# Patient Record
Sex: Female | Born: 1975
Health system: Southern US, Community
[De-identification: ages and names within clinical notes are randomized; demographics above are authoritative.]

## PROBLEM LIST (undated history)

## (undated) DIAGNOSIS — O24419 Gestational diabetes mellitus in pregnancy, unspecified control: Secondary | ICD-10-CM

---

## 2012-02-07 ENCOUNTER — Ambulatory Visit: Payer: Self-pay | Admitting: Family Medicine

## 2012-02-22 ENCOUNTER — Other Ambulatory Visit (HOSPITAL_COMMUNITY)
Admission: RE | Admit: 2012-02-22 | Discharge: 2012-02-22 | Disposition: A | Discharge status: Home / Self Care | Payer: BC Managed Care – PPO | Source: Ambulatory Visit | Attending: Obstetrics and Gynecology | Admitting: Obstetrics and Gynecology

## 2012-02-22 DIAGNOSIS — Z01419 Encounter for gynecological examination (general) (routine) without abnormal findings: Secondary | ICD-10-CM | POA: Insufficient documentation

## 2012-05-10 ENCOUNTER — Other Ambulatory Visit: Payer: Self-pay | Admitting: Obstetrics and Gynecology

## 2012-05-10 DIAGNOSIS — O200 Threatened abortion: Secondary | ICD-10-CM

## 2012-05-26 ENCOUNTER — Encounter (HOSPITAL_COMMUNITY)
Admission: AD | Disposition: A | Discharge status: Home / Self Care | Payer: Self-pay | Source: Ambulatory Visit | Attending: Obstetrics and Gynecology

## 2012-05-26 ENCOUNTER — Encounter (HOSPITAL_COMMUNITY): Payer: Self-pay | Admitting: *Deleted

## 2012-05-26 ENCOUNTER — Inpatient Hospital Stay (HOSPITAL_COMMUNITY): Payer: BC Managed Care – PPO | Admitting: Anesthesiology

## 2012-05-26 ENCOUNTER — Ambulatory Visit: Admit: 2012-05-26 | Payer: Self-pay | Admitting: Obstetrics and Gynecology

## 2012-05-26 ENCOUNTER — Encounter (HOSPITAL_COMMUNITY): Payer: Self-pay | Admitting: Anesthesiology

## 2012-05-26 ENCOUNTER — Other Ambulatory Visit: Payer: Self-pay | Admitting: Obstetrics and Gynecology

## 2012-05-26 ENCOUNTER — Inpatient Hospital Stay (HOSPITAL_COMMUNITY)
Admission: AD | Admit: 2012-05-26 | Discharge: 2012-05-26 | Disposition: A | Discharge status: Home / Self Care | Payer: BC Managed Care – PPO | Source: Ambulatory Visit | Attending: Obstetrics and Gynecology | Admitting: Obstetrics and Gynecology

## 2012-05-26 DIAGNOSIS — O034 Incomplete spontaneous abortion without complication: Secondary | ICD-10-CM

## 2012-05-26 HISTORY — PX: DILATION AND EVACUATION: SHX1459

## 2012-05-26 LAB — DIFFERENTIAL
Basophils Absolute: 0 10*3/uL (ref 0.0–0.1)
Lymphocytes Relative: 32 % (ref 12–46)
Neutro Abs: 4.6 10*3/uL (ref 1.7–7.7)
Neutrophils Relative %: 61 % (ref 43–77)

## 2012-05-26 LAB — CBC
HCT: 35 % — ABNORMAL LOW (ref 36.0–46.0)
Platelets: 262 10*3/uL (ref 150–400)
RDW: 13.8 % (ref 11.5–15.5)
WBC: 7.5 10*3/uL (ref 4.0–10.5)

## 2012-05-26 SURGERY — DILATION AND EVACUATION, UTERUS
Anesthesia: Monitor Anesthesia Care | Site: Vagina | Wound class: Clean Contaminated

## 2012-05-26 MED ORDER — CEFAZOLIN SODIUM-DEXTROSE 2-3 GM-% IV SOLR
2.0000 g | INTRAVENOUS | Status: AC
  Administered 2012-05-26: 2 g via INTRAVENOUS
  Filled 2012-05-26: qty 50

## 2012-05-26 MED ORDER — LACTATED RINGERS IV SOLN
INTRAVENOUS | Status: DC
  Administered 2012-05-26: 20:00:00 via INTRAVENOUS

## 2012-05-26 MED ORDER — KETOROLAC TROMETHAMINE 30 MG/ML IJ SOLN
INTRAMUSCULAR | Status: DC | PRN
  Administered 2012-05-26: 30 mg via INTRAVENOUS

## 2012-05-26 MED ORDER — LIDOCAINE HCL 2 % IJ SOLN
INTRAMUSCULAR | Status: DC | PRN
  Administered 2012-05-26: 10 mL

## 2012-05-26 MED ORDER — IBUPROFEN 600 MG PO TABS: 600.0000 mg | ORAL_TABLET | Freq: Four times a day (QID) | ORAL | Status: AC | PRN

## 2012-05-26 MED ORDER — FENTANYL CITRATE 0.05 MG/ML IJ SOLN
INTRAMUSCULAR | Status: DC | PRN
  Administered 2012-05-26 (×4): 25 ug via INTRAVENOUS

## 2012-05-26 MED ORDER — MIDAZOLAM HCL 5 MG/5ML IJ SOLN
INTRAMUSCULAR | Status: DC | PRN
  Administered 2012-05-26: 2 mg via INTRAVENOUS

## 2012-05-26 MED ORDER — LIDOCAINE HCL (CARDIAC) 20 MG/ML IV SOLN
INTRAVENOUS | Status: DC | PRN
  Administered 2012-05-26: 50 mg via INTRAVENOUS

## 2012-05-26 MED ORDER — ONDANSETRON HCL 4 MG/2ML IJ SOLN
INTRAMUSCULAR | Status: DC | PRN
  Administered 2012-05-26: 4 mg via INTRAVENOUS

## 2012-05-26 MED ORDER — PROPOFOL 10 MG/ML IV EMUL
INTRAVENOUS | Status: DC | PRN
  Administered 2012-05-26: 200 ug/kg/min via INTRAVENOUS

## 2012-05-26 MED ORDER — METHYLERGONOVINE MALEATE 0.2 MG PO TABS: 0.2000 mg | ORAL_TABLET | Freq: Four times a day (QID) | ORAL | Status: AC

## 2012-05-26 SURGICAL SUPPLY — 18 items
CATH ROBINSON RED A/P 16FR (CATHETERS) ×2 IMPLANT
CLOTH BEACON ORANGE TIMEOUT ST (SAFETY) ×2 IMPLANT
DECANTER SPIKE VIAL GLASS SM (MISCELLANEOUS) ×2 IMPLANT
GLOVE BIO SURGEON STRL SZ7 (GLOVE) ×6 IMPLANT
GLOVE SKINSENSE STRL SZ6.0 (GLOVE) ×2 IMPLANT
GOWN PREVENTION PLUS LG XLONG (DISPOSABLE) ×4 IMPLANT
KIT BERKELEY 1ST TRIMESTER 3/8 (MISCELLANEOUS) ×2 IMPLANT
NEEDLE SPNL 22GX3.5 QUINCKE BK (NEEDLE) ×2 IMPLANT
NS IRRIG 1000ML POUR BTL (IV SOLUTION) ×2 IMPLANT
PACK VAGINAL MINOR WOMEN LF (CUSTOM PROCEDURE TRAY) ×2 IMPLANT
PAD PREP 24X48 CUFFED NSTRL (MISCELLANEOUS) ×2 IMPLANT
SET BERKELEY SUCTION TUBING (SUCTIONS) ×2 IMPLANT
SYR CONTROL 10ML LL (SYRINGE) ×2 IMPLANT
TOWEL OR 17X24 6PK STRL BLUE (TOWEL DISPOSABLE) ×4 IMPLANT
VACURETTE 10 RIGID CVD (CANNULA) IMPLANT
VACURETTE 7MM CVD STRL WRAP (CANNULA) IMPLANT
VACURETTE 8 RIGID CVD (CANNULA) ×2 IMPLANT
VACURETTE 9 RIGID CVD (CANNULA) IMPLANT

## 2012-05-26 NOTE — Discharge Instructions (Addendum)
Dilation and Curettage or Vacuum Curettage Care After Refer to this sheet in the next few weeks. These instructions provide you with general information on caring for yourself after your procedure. Your caregiver may also give you more specific instructions. Your treatment has been planned according to current medical practices, but problems sometimes occur. Call your caregiver if you have any problems or questions after your procedure. HOME CARE INSTRUCTIONS   Do not drive for 24 hours.   Wait 1 week before returning to strenuous activities.   Take your temperature 2 times a day for 4 days and write it down. Provide these temperatures to your caregiver if you develop a fever.   Avoid long periods of standing, and do no heavy lifting (more than 10 pounds or 4.5 kg), pushing, or pulling.   Limit stair climbing to once or twice a day.   Take rest periods often.   You may resume your usual diet.   Drink enough fluids to keep your urine clear or pale yellow.   You should return to your usual bowel function. If constipation should occur, you may:   Take a mild laxative with permission from your caregiver.   Add fruit and bran to your diet.   Drink more fluids.   Take showers instead of baths until your caregiver gives you permission to take baths.   Do not go swimming or use a hot tub until your caregiver gives you permission.   Try to have someone with you or available to you the first 24 to 48 hours, especially if you had a general anesthetic.   Do not douche, use tampons, or have intercourse until after your follow-up appointment, or when your caregiver approves.   Only take over-the-counter or prescription medicines for pain, discomfort, or fever as directed by your caregiver. Do not take aspirin. It can cause bleeding.   If a prescription was given, follow your caregiver's directions.   Keep all your follow-up appointments recommended by your caregiver.  SEEK MEDICAL CARE  IF:   You have increasing cramps or pain not relieved with medicine.   You have abdominal pain which does not seem to be related to the same area of earlier cramping and pain.   You have bad smelling vaginal discharge.   You have a rash.   You have problems with any medicine.  SEEK IMMEDIATE MEDICAL CARE IF:   You have bleeding that is heavier than a normal menstrual period.   You have a fever.   You have chest pain.   You have shortness of breath.   You feel dizzy or feel like fainting.   You pass out.   You have pain in your shoulder strap area.   You have heavy vaginal bleeding with or without blood clots.  MAKE SURE YOU:   Understand these instructions.   Will watch your condition.   Will get help right away if you are not doing well or get worse.  Document Released: 11/16/2000 Document Revised: 11/08/2011 Document Reviewed: 06/16/2009 [facility] Patient Information 2012 [place], [place].DISCHARGE INSTRUCTIONS: D&C / D&E The following instructions have been prepared to help you care for yourself upon your return home.   Personal hygiene: Marland Kitchen Use sanitary pads for vaginal drainage, not tampons. . Shower the day after your procedure. . NO tub baths, pools or Jacuzzis for 2-3 weeks. . Wipe front to back after using the bathroom.  Activity and limitations: . Do NOT drive or operate any equipment for 24 hours. The  effects of anesthesia are still present and drowsiness may result. . Do NOT rest in bed all day. . Walking is encouraged. . Walk up and down stairs slowly. . You may resume your normal activity in one to two days or as indicated by your physician.  Sexual activity: NO intercourse for at least 2 weeks after the procedure, or as indicated by your physician.  Diet: Eat a light meal as desired this evening. You may resume your usual diet tomorrow.  Return to work: You may resume your work activities in one to two days or as indicated by your doctor.  What to  expect after your surgery: Expect to have vaginal bleeding/discharge for 2-3 days and spotting for up to 10 days. It is not unusual to have soreness for up to 1-2 weeks. You may have a slight burning sensation when you urinate for the first day. Mild cramps may continue for a couple of days. You may have a regular period in 2-6 weeks.  Call your doctor for any of the following: . Excessive vaginal bleeding, saturating and changing one pad every hour. . Inability to urinate 6 hours after discharge from hospital. . Pain not relieved by pain medication. . Fever of 100.4 F or greater. . Unusual vaginal discharge or odor.  Return to office ________________ Call for an appointment ___________________  Patient's signature: ______________________  Nurse's signature ________________________  Post Anesthesia Care Unit [phone]

## 2012-05-26 NOTE — Transfer of Care (Signed)
Immediate Anesthesia Transfer of Care Note  Patient: Misty Gibson  Procedure(s) Performed: Procedure(s) (LRB): DILATATION AND EVACUATION (N/A)  Patient Location: PACU  Anesthesia Type: MAC  Level of Consciousness: awake  Airway & Oxygen Therapy: Patient Spontanous Breathing and Patient connected to nasal cannula oxygen  Post-op Assessment: Report given to PACU RN and Post -op Vital signs reviewed and stable  Post vital signs: stable  Complications: No apparent anesthesia complications

## 2012-05-26 NOTE — MAU Note (Signed)
Pt states was sent from Dr. Ginnie Smart office for [facility] for incomplete miscarriage, notes scant amt of bleeding, denies pain at present. Surgery scheduled at 2000 per pt. NPO food since 1200, last fluids at 1400-drank water.

## 2012-05-26 NOTE — Anesthesia Postprocedure Evaluation (Signed)
  Anesthesia Post-op Note  Patient: Misty Gibson  Procedure(s) Performed: Procedure(s) (LRB): DILATATION AND EVACUATION (N/A) Patient is awake and responsive. Pain and nausea are reasonably well controlled. Vital signs are stable and clinically acceptable. Oxygen saturation is clinically acceptable. There are no apparent anesthetic complications at this time. Patient is ready for discharge.

## 2012-05-26 NOTE — Anesthesia Preprocedure Evaluation (Signed)
Anesthesia Evaluation Anesthesia Physical Anesthesia Plan  ASA: II  Anesthesia Plan: General and MAC   Post-op Pain Management:    Induction: Intravenous  Airway Management Planned: LMA, Mask and Simple Face Mask  Additional Equipment:   Intra-op Plan:   Post-operative Plan:   Informed Consent: I have reviewed the patients History and Physical, chart, labs and discussed the procedure including the risks, benefits and alternatives for the proposed anesthesia with the patient or authorized representative who has indicated his/her understanding and acceptance.   Dental Advisory Given  Plan Discussed with: CRNA and Surgeon  Anesthesia Plan Comments: (  Discussed MAC and general anesthesia, including possible nausea, instrumentation of airway, sore throat,pulmonary aspiration, etc. I asked if the were any outstanding questions, or  concerns before we proceeded. )        Anesthesia Quick Evaluation

## 2012-05-26 NOTE — Brief Op Note (Signed)
05/26/2012  8:26 PM  PATIENT:  Misty Gibson  36 y.o. female  PRE-OPERATIVE DIAGNOSIS:  Incomplete ab   POST-OPERATIVE DIAGNOSIS:  Same  PROCEDURE:  Procedure(s) (LRB): DILATATION AND EVACUATION (N/A)  SURGEON:  Surgeon(s) and Role:    * Geryl Rankins, MD - Primary  PHYSICIAN ASSISTANT:   ASSISTANTS: none   ANESTHESIA:   MAC, Local Xylocaine 2 % 10 cc  EBL:  Total I/O In: 400 [I.V.:400] Out: 25 [Urine:25]  BLOOD ADMINISTERED:none  DRAINS: none   LOCAL MEDICATIONS USED:  XYLOCAINE   SPECIMEN:  Source of Specimen:  Products of conception  DISPOSITION OF SPECIMEN:  PATHOLOGY  COUNTS:  YES  TOURNIQUET:  * No tourniquets in log *  DICTATION: .Other Dictation: Dictation Number [id number]  PLAN OF CARE: Discharge to home after PACU  PATIENT DISPOSITION:  PACU - hemodynamically stable.   Delay start of Pharmacological VTE agent (>24hrs) due to surgical blood loss or risk of bleeding: not applicable

## 2012-05-26 NOTE — MAU Note (Signed)
Seen at the office today. Told to come to hospital for D & C

## 2012-05-26 NOTE — H&P (Signed)
05/26/2012  History of Present Illness  General:  36 y/o G2P1A1 presents for f/u on incomplete abortion. Pt is s/p Cytotec 800 mcg per vagina last week. She reports she has spotting during the day but passes small clots and has increased bleeding. She denies pelvic pain or fever/chills. After counseling, pt desires to proceed with a Suction D&C. Pt is due to travel tomorrow evening.  Ultrasound shows thickened EMS with blood flow suggestive of Retained POCs.   Current Medications  Prenatal 28-0.8 MG Tablet 1 tablet Once a day  Medication List reviewed and reconciled with the patient   Past Medical History  1st pregnancy - ? HELLP/preeclampsia - "acute fatty liver syndrome" per patient  borderline vitamin B12 deficiency  Borderline iron deficiency anemia   Surgical History  c-section 2009   Family History  Father: alive 37 yrs HTN   Mother: alive 55 yrs HTN, kidney disease   Brother 1: alive 15 yrs A + W   daughter - seizure disorder (possibly just febrile seizures), denies any GYN family cancer hx.   Social History  General:  History of smoking  cigarettes: Never smoked no Smoking.  no Alcohol.  no Caffeine.  no Recreational drug use.  Exercise: yes, minimal, walks.  Occupation: stay at home mom.  Education: Masters Lobby[employer].  Marital Status: married.  Children: 1, girls.    Gyn History  Sexual activity yes.  LMP 03/14/12, spotting since this time.  Denies H/O Birth control .  Last pap smear date 02/22/12.  Denies H/O Last mammogram date .  Denies H/O Abnormal pap smear .  Denies H/O STD .    OB History  Number of pregnancies 2.  miscarriages 0.  abortion 1.  Pregnancy # 1 abortion.  Pregnancy # 2 live birth, C-section, girl.    Allergies  N.K.D.A.   Hospitalization/Major Diagnostic Procedure  c-section 2009   Review of Systems  See HPI.   Vital Signs  Wt 179, Ht 64.5, BMI 30.25, Pulse sitting 100, BP sitting 100/57.   Physical Examination    GENERAL:  Patient appears alert and oriented.  General Appearance: well-appearing, well-developed, no acute distress.  Speech: clear.  ABDOMEN:  General: non tender, non distended.  FEMALE GENITOURINARY:  Cervix visualized, healthy appearing, no discharge, no lesions, external os appears dilated but internal os closed.  Vagina: pink/moist mucosa, no lesions, dark brown blood, no foul odor, no active bleeding.  Vulva: normal, no lesions, no skin discoloration, blood at perineum.     Assessments   1. Miscarriage - 634.90 (Primary), incomplete   2. Incomplete miscarriage - 634.91   Treatment  1. Miscarriage  LAB: Urine HCG   Negative Neg    Jalan Bodi B 05/26/2012 06:38:04 PM >    Diagnostic Imaging:US ECHO TRANSVAGINAL  PREGNANCY TEST POSITVE. NEG WAS ERROR.  Referral To:Geryl Rankins [facility] - Gynecology Reason:Precert and schedule for Suction D&C tonight at 8 pm     2. Incomplete miscarriage  Pt counseled on R/B/A of D&C. Desires to proceed with surgery.    Procedure Codes  13086 ECHO TRANSVAGINAL  81025 URINE PREGNANCY TEST   Follow Up  2 Weeks post op

## 2012-05-27 ENCOUNTER — Encounter (HOSPITAL_COMMUNITY): Payer: Self-pay | Admitting: Obstetrics and Gynecology

## 2012-05-27 NOTE — Op Note (Signed)
NAMEMESHELLE, HOLNESS NO.:  [id number]  MEDICAL RECORD NO.:  [id number]  LOCATION:  [facility]                          FACILITY:  [facility]  PHYSICIAN:  Pieter Partridge, MD   DATE OF BIRTH:  August 28, 1976  DATE OF PROCEDURE:  05/26/2012 DATE OF DISCHARGE:  05/26/2012                              OPERATIVE REPORT   PREOPERATIVE DIAGNOSIS:  Incomplete abortion.  POSTOPERATIVE DIAGNOSIS:  Incomplete abortion.  PROCEDURES:  Suction, D and C.  SURGEON:  Pieter Partridge, MD  ASSISTANT:  None.  ANESTHESIA:  MAC with 2% Xylocaine, 10 mL local.  EBL:  Minimal.  URINE OUTPUT PRIOR TO THE CASE:  25 mL.  SPECIMEN:  Products of conception to Pathology.  DISPOSITION:  To PACU, hemodynamically stable.  PLAN OF CARE:  Discharge after PACU.  INDICATIONS:  Misty Gibson is a 36 year old gravida 2, para 1, who was trying to conceive.  She started to have bleeding and at that time was diagnosed with a threatened miscarriage at approximately 6 weeks.  There was an intrauterine gestational sac, but there was no fetal pole noted as would be expected based on weeks of gestation and also quantitative beta-HCG levels.  Quants were followed as subsequently were declining. The patient was counseled on medical versus surgical management versus expectant management, and the patient desired to proceed with medical therapy.  She was given Cytotec and had Cytotec twice, but her ultrasound today still shows some retained products with echogenic material in the cavity with some blood flow due to these products.  The patient was counseled on surgical management and desired to proceed.  PROCEDURE:  The patient was identified in the maternity admission unit. She was then taken to the operating room where she underwent MAC anesthesia without complication.  She was then placed in the dorsal lithotomy position, and prepped and draped in a normal sterile fashion. I and O bladder was done prior to  beginning of the procedure.  A Pederson speculum was placed in the vagina.  The paracervical block was performed with 10 mL of Xylocaine 2%.  Single-tooth tenaculum was then used to grasp the anterior lip of the cervix.  The cervix was dilated up to a 9 Hegar dilator easily.  Uterus was sounded to 7 cm.  An 8-mm suction curette was then advanced to the uterine fundus and a large clot with tissue consistent with products of conception was visualized. Sharp curettage of the uterus was performed and gritty cry was noted with just one pass of the curette.  Another suction curettage was performed.  No bleeding was noted at the end of the procedure.  Single- tooth tenaculum was removed and the tenaculum site was hemostatic.  All instruments were removed from the vagina.  The patient was tolerated the procedure well.  She has taken to the recovery room in stable condition. She had Ancef 2 g IV prior to procedure.  SCDs were not placed due to decreased length of case, she tolerated it well.     Pieter Partridge, MD     EBV/MEDQ  D:  05/26/2012  T:  05/27/2012  Job:  [id number]

## 2012-11-19 ENCOUNTER — Other Ambulatory Visit: Payer: Self-pay | Admitting: Obstetrics and Gynecology

## 2012-11-19 DIAGNOSIS — O26859 Spotting complicating pregnancy, unspecified trimester: Secondary | ICD-10-CM

## 2012-12-04 ENCOUNTER — Other Ambulatory Visit (HOSPITAL_COMMUNITY): Payer: BC Managed Care – PPO

## 2012-12-05 LAB — OB RESULTS CONSOLE HIV ANTIBODY (ROUTINE TESTING): HIV: NONREACTIVE

## 2012-12-08 LAB — OB RESULTS CONSOLE RUBELLA ANTIBODY, IGM: Rubella: NON-IMMUNE/NOT IMMUNE

## 2012-12-10 ENCOUNTER — Other Ambulatory Visit: Payer: Self-pay

## 2012-12-17 LAB — OB RESULTS CONSOLE GC/CHLAMYDIA
Chlamydia: NEGATIVE
Gonorrhea: NEGATIVE

## 2012-12-23 ENCOUNTER — Other Ambulatory Visit: Payer: Self-pay | Admitting: Obstetrics and Gynecology

## 2012-12-23 DIAGNOSIS — Z3682 Encounter for antenatal screening for nuchal translucency: Secondary | ICD-10-CM

## 2013-01-13 ENCOUNTER — Ambulatory Visit (HOSPITAL_COMMUNITY): Payer: BC Managed Care – PPO

## 2013-01-14 ENCOUNTER — Ambulatory Visit (HOSPITAL_COMMUNITY): Payer: BC Managed Care – PPO

## 2013-01-14 ENCOUNTER — Ambulatory Visit (HOSPITAL_COMMUNITY): Admission: RE | Admit: 2013-01-14 | Payer: BC Managed Care – PPO | Source: Ambulatory Visit

## 2013-01-14 ENCOUNTER — Encounter (HOSPITAL_COMMUNITY): Payer: BC Managed Care – PPO

## 2013-01-16 ENCOUNTER — Ambulatory Visit (HOSPITAL_COMMUNITY): Payer: BC Managed Care – PPO

## 2013-01-16 ENCOUNTER — Ambulatory Visit (HOSPITAL_COMMUNITY): Admission: RE | Admit: 2013-01-16 | Payer: BC Managed Care – PPO | Source: Ambulatory Visit

## 2013-01-20 ENCOUNTER — Other Ambulatory Visit: Payer: Self-pay | Admitting: Obstetrics and Gynecology

## 2013-01-20 ENCOUNTER — Ambulatory Visit (HOSPITAL_COMMUNITY)
Admission: RE | Admit: 2013-01-20 | Discharge: 2013-01-20 | Disposition: A | Discharge status: Home / Self Care | Payer: BC Managed Care – PPO | Source: Ambulatory Visit | Attending: Obstetrics and Gynecology | Admitting: Obstetrics and Gynecology

## 2013-01-20 ENCOUNTER — Encounter (HOSPITAL_COMMUNITY): Payer: Self-pay

## 2013-01-20 ENCOUNTER — Other Ambulatory Visit: Payer: Self-pay

## 2013-01-20 DIAGNOSIS — Z3682 Encounter for antenatal screening for nuchal translucency: Secondary | ICD-10-CM

## 2013-01-20 DIAGNOSIS — O09529 Supervision of elderly multigravida, unspecified trimester: Secondary | ICD-10-CM | POA: Insufficient documentation

## 2013-01-20 DIAGNOSIS — K760 Fatty (change of) liver, not elsewhere classified: Secondary | ICD-10-CM

## 2013-01-20 DIAGNOSIS — O34219 Maternal care for unspecified type scar from previous cesarean delivery: Secondary | ICD-10-CM | POA: Insufficient documentation

## 2013-01-20 NOTE — Consult Note (Signed)
Maternal Fetal Medicine Consultation  Requesting Provider(s): Geryl Rankins, MD  Reason for consultation: History of Acute Fatty Liver of pregnancy  HPI: Misty Gibson is a 37 yo [id number] currently at 73 6/7 weeks- seen today due to a history of acute fatty liver of pregnancy.  [name] was diagnosed with this disorder in 2009.  She reports that her pregnancy was otherwise uncomplicated - developed "dramatic" lower extremity swelling and facial edema at approximately [redacted] weeks gestation - later developed profuse vomiting, oliguria, loss of appetite and some vaginal bleeding.  She was seen on L&D and noted to have elevated LFTs and hypoglycemia. She was diagnosed with acute fatty liver and underwent a primary C-section at 36 weeks.  She was hospitalized for approximately 2 weeks after delivery - primarily due to pleural effusion / pulmonary edema with a normalization of the LFTs.  She reports that the newborn had an extensive work up in the NICU and to her knowledge has no liver issues and is currently doing well.  Her C-section scar was left open and healed by secondary intent.  [name] is without complaints today.  Her pregnancy thus far has been uncomplicated.  OB History: OB History   Grav Para Term Preterm Abortions TAB SAB Ect Mult Living   4    2 1 1   1     G1 - TAB G2- 36 week C-section due to acute fatty liver G3- SAB  PMH: neg  PSH:   Past Surgical History  Procedure Laterality Date  . Cesarean section  2009  . Dilation and evacuation  05/26/2012    Procedure: DILATATION AND EVACUATION;  Surgeon: Geryl Rankins, MD;  Location: [facility];  Service: Gynecology;  Laterality: N/A;   Meds: Prenatal vitamins  Allergies: NKDA  FH: Maternal grandfather "liver cirrhosis" - not felt to be due to alcohol. ? Viral hepatitis  Soc: denies tobacco, ETOH or illicit drug use  Review of Systems: no vaginal bleeding or cramping/contractions, no LOF, no nausea/vomiting. All other systems  reviewed and are negative.   PE: Wt: 197 #, BP 103/65, Pulse 87  GEN: well-appearing female ABD: gravid, NT  Please see separate document for fetal ultrasound report.   A/P: 1) IUP At 13 6/7 weeks         2) Advanced maternal age - see separate ultrasound report.  The patient elected to undergo NIPT (cell free fetal DNA).  Recommend follow up for detailed anatomy ultrasound at [redacted] weeks gestation         3) History of acute fatty liver of pregnancy - Acute fatty liver of pregnancy is a rare disorder, thought to occur in 1:7000- 1:20,000 deliveries.  The disorder typically occurs during the 3rd trimester of pregnancy and [name] describes a typical presentation for this disorder. About 1/2 of patients will have signs of preeclampsia at presentation or at some time during the course of the illness. There appears to be an association of cases of acute fatty liver of pregnancy with one of the inherited defects in mitochondrial beta-oxidation of fatty acids,  long-chain 3-hydroxyacyl CoA dehydrogenase deficiency (LCHAD).  [name] reports that she has never previously undergone genetic testing for this disorder, but the fact that her child underwent extensive testing in the NICU and is currently doing well would make this much less likely.  Acute fatty liver of pregnancy can recur in subsequent pregnancies, even if the LCHAD mutation is negative.  Due to the rarity of this disorder, the exact  risk of recurrence is unknown.  Recommendations: 1) Would recommend requesting records from the patient's previous delivery 2) If not already done - recommend baseline LFTs, preeclampsia labs and 24-hr urine protein / Creatinine clearance.  Would consider checking LFTs at least every trimester 3) Serial growth scans in the 3rd trimester 4) Antepartum fetal testing (NSTs) beginning at 32 weeks 5) We will explore testing the LCHAD - will contact the patient once we are able to work out the logistics and  appropriate lab.   6) Would have low threshold for checking LFTs if the patient develops typical symptoms in the 3rd trimester (vomiting, RUQ pain, anorexia)  Thank you for the opportunity to be a part of the care of [name]. Please contact our office if we can be of further assistance.   I spent approximately 30 minutes with this patient with over 50% of time spent in face-to-face counseling.  Alpha Gula, MD Maternal Fetal Medicine

## 2013-01-30 ENCOUNTER — Telehealth (HOSPITAL_COMMUNITY): Payer: Self-pay | Admitting: MS"

## 2013-01-30 NOTE — Telephone Encounter (Signed)
Called Misty Gibson to discuss her [facility], cell free fetal DNA testing. Patient was identified by name and DOB. Testing was offered because of advanced maternal age. We reviewed that these are within normal limits, showing a less than 1 in 10,000 risk for trisomies 21, 18 and 13. We reviewed that this testing identifies > 99% of pregnancies with trisomy 21, >98% of pregnancies with trisomy 28, and >80% with trisomy 83; the false positive rate is <0.1% for all conditions.  She understands that this testing does not identify all genetic conditions.  Misty Gibson asked if she needs to consider amniocentesis given this result. We reviewed that NIPT is a screen, and thus, amniocentesis would be available if she desired diagnostic testing in the pregnancy. We reviewed the associated risk of complications with amniocentesis as well as the detection rates of NIPT. Reviewed that targeted ultrasound in the second trimester is also a helpful screening tool for chromosome conditions but is also not diagnostic. Misty Gibson stated that she feels comfortable with these results and likely will not pursue amniocentesis in pregnancy.   Additionally, we discussed the option of carrier testing for the common LCHAD mutation, which was discussed at the time of Misty Gibson MFM consult on 01/20/13 given her history of acute fatty liver of pregnancy. Discussed that the patient can return for lab draw at any point or this can also be done at the time of her previously scheduled anatomy ultrasound on 02/23/13. Patient stated that she would wait until 02/23/13 appointment given that her husband will also be with her and information about the testing can be reviewed with both of them at that point. All questions were answered to her satisfaction, she was encouraged to call with additional questions or concerns.  Quinn Plowman, MS Patent attorney

## 2013-02-19 ENCOUNTER — Other Ambulatory Visit: Payer: Self-pay | Admitting: Obstetrics and Gynecology

## 2013-02-23 ENCOUNTER — Ambulatory Visit (HOSPITAL_COMMUNITY): Payer: BC Managed Care – PPO

## 2013-02-23 ENCOUNTER — Other Ambulatory Visit (HOSPITAL_COMMUNITY): Payer: BC Managed Care – PPO

## 2013-02-24 ENCOUNTER — Other Ambulatory Visit: Payer: Self-pay | Admitting: Obstetrics and Gynecology

## 2013-02-24 ENCOUNTER — Ambulatory Visit (HOSPITAL_COMMUNITY)
Admission: RE | Admit: 2013-02-24 | Discharge: 2013-02-24 | Disposition: A | Discharge status: Home / Self Care | Payer: BC Managed Care – PPO | Source: Ambulatory Visit | Attending: Obstetrics and Gynecology | Admitting: Obstetrics and Gynecology

## 2013-02-24 ENCOUNTER — Other Ambulatory Visit: Payer: Self-pay

## 2013-02-24 VITALS — BP 108/64 | HR 90 | Wt 199.5 lb

## 2013-02-24 DIAGNOSIS — O358XX0 Maternal care for other (suspected) fetal abnormality and damage, not applicable or unspecified: Secondary | ICD-10-CM | POA: Insufficient documentation

## 2013-02-24 DIAGNOSIS — Z0489 Encounter for examination and observation for other specified reasons: Secondary | ICD-10-CM

## 2013-02-24 DIAGNOSIS — O09522 Supervision of elderly multigravida, second trimester: Secondary | ICD-10-CM

## 2013-02-24 DIAGNOSIS — Z1389 Encounter for screening for other disorder: Secondary | ICD-10-CM | POA: Insufficient documentation

## 2013-02-24 DIAGNOSIS — Z363 Encounter for antenatal screening for malformations: Secondary | ICD-10-CM | POA: Insufficient documentation

## 2013-02-24 DIAGNOSIS — O09529 Supervision of elderly multigravida, unspecified trimester: Secondary | ICD-10-CM | POA: Insufficient documentation

## 2013-02-24 DIAGNOSIS — O34219 Maternal care for unspecified type scar from previous cesarean delivery: Secondary | ICD-10-CM | POA: Insufficient documentation

## 2013-02-24 DIAGNOSIS — Z8751 Personal history of pre-term labor: Secondary | ICD-10-CM | POA: Insufficient documentation

## 2013-02-24 NOTE — Progress Notes (Signed)
Lamija Besse  was seen today for an ultrasound appointment.  See full report in [facility].  Comments: Ms. Aguado was seen today due to advanced maternal age and history of acute fatty liver of pregnancy with her last delivery.  See previous consult.  NIPT (cell free fetal DNA) was previously performed that was low risk for aneuploidy.  A small Choroid plexus cyst was noted.  This is a common variant (1-2%) of all pregnancies, and carries a small association with Trisomy 18.  However, there were no other ultrasound stigmata of Trisomy 68 and with a low risk NIPT, the risk fo Trisomy 18 is essentially negligible.   Impression: Single IUP at 19 4/7 weeks Isolated choroid plexus cyst The remainder of the fetal anatomy is normal although somewhat limited views of the fetal heart were obtained No other markers associated with aneuploidy were noted Marginal posterior placenta previa Normal amniotic fluid volume  Recommendations: Recommend follow-up ultrasound examination in 6 weeks for growth and to reevaluate the fetal heart / placental location  Alpha Gula, MD

## 2013-03-09 ENCOUNTER — Telehealth (HOSPITAL_COMMUNITY): Payer: Self-pay | Admitting: MS"

## 2013-03-09 NOTE — Telephone Encounter (Signed)
Called Ms. 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 Ms. Borchardt was offered carrier testing for the common mutation in the HADHA gene, given her history of acute fatty liver in pregnancy. Women who are carriers of HADHA mutations are at increased risk to develop acute fatty liver of pregnancy, particularly when the fetus in that pregnancy is affected with Long Chain 3-hydroxyacyl-CoA dehydrogenase (LCHAD) deficiency.   Ms. Hovsepian testing was normally, with no detection of the [id number] mutation on either of her HADHA alleles. We reviewed that this mutation is found in approximately 75-87% of cases of LCHAD deficiency. Thus, this normal testing does not rule out carrier status, but greatly reduces the chance that she is a carrier. Additionally, we discussed that given that her daughter is currently 62 years old and does not appear to have symptoms of LCHAD deficiency also reduces the chance that Ms. Renninger's previous acute fatty liver of pregnancy is due to carrier status for LCHAD deficiency. We reviewed that sequencing of the HADHA gene is available, which would be expected to detect an additional approximate 13-25% of mutations in the HADHA gene. However, we also reviewed the additional cost of gene sequencing. Ms. Thielke stated that she feels comfortable with the results of this testing at this time and planned to further discuss these results at the time of her follow-up ultrasound in our office on 04/08/13.   Clydie Braun Oswald Pott 03/09/2013 12:20 PM

## 2013-04-08 ENCOUNTER — Ambulatory Visit (HOSPITAL_COMMUNITY)
Admission: RE | Admit: 2013-04-08 | Discharge: 2013-04-08 | Disposition: A | Discharge status: Home / Self Care | Payer: BC Managed Care – PPO | Source: Ambulatory Visit | Attending: Obstetrics and Gynecology | Admitting: Obstetrics and Gynecology

## 2013-04-08 VITALS — BP 93/59 | HR 100 | Wt 204.2 lb

## 2013-04-08 DIAGNOSIS — IMO0002 Reserved for concepts with insufficient information to code with codable children: Secondary | ICD-10-CM

## 2013-04-08 DIAGNOSIS — O4400 Placenta previa specified as without hemorrhage, unspecified trimester: Secondary | ICD-10-CM | POA: Insufficient documentation

## 2013-04-08 DIAGNOSIS — O09522 Supervision of elderly multigravida, second trimester: Secondary | ICD-10-CM

## 2013-04-08 DIAGNOSIS — Z3689 Encounter for other specified antenatal screening: Secondary | ICD-10-CM | POA: Insufficient documentation

## 2013-04-08 DIAGNOSIS — O350XX0 Maternal care for (suspected) central nervous system malformation in fetus, not applicable or unspecified: Secondary | ICD-10-CM | POA: Insufficient documentation

## 2013-04-08 DIAGNOSIS — O3500X0 Maternal care for (suspected) central nervous system malformation or damage in fetus, unspecified, not applicable or unspecified: Secondary | ICD-10-CM | POA: Insufficient documentation

## 2013-04-08 DIAGNOSIS — Z0489 Encounter for examination and observation for other specified reasons: Secondary | ICD-10-CM

## 2013-04-08 NOTE — Progress Notes (Signed)
Misty Gibson  was seen today for an ultrasound appointment.  See full report in [facility].  Impression: Single IUP at 25 6/7 weeks Normal interval anatomy.  The previously noted choroid plexus cyst appears to have resolved Fetal growth is appropriate (60th %tile) Marginal posterior placenta previa Normal amniotic fluid volume  Recommendations: Recommend follow-up ultrasound examination in 4 weeks for interval growth and to reevaluate placental location.  Alpha Gula, MD

## 2013-04-18 IMAGING — US US OB COMP LESS 14 WK
1 series · 13 of 28 positions shown · non-contrast
Comparison: none

[Series 1: us ob comp less 14 wk · 0.18mm/px · 13 of 44 slices shown]
[im 2/44]
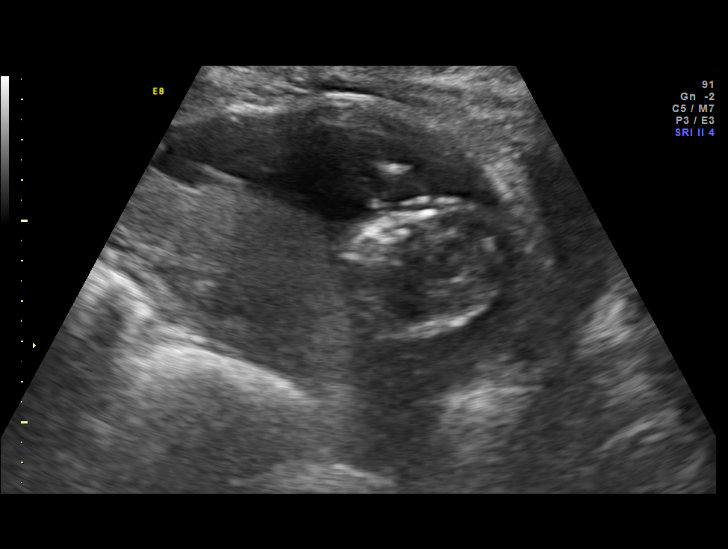
[im 5/44]
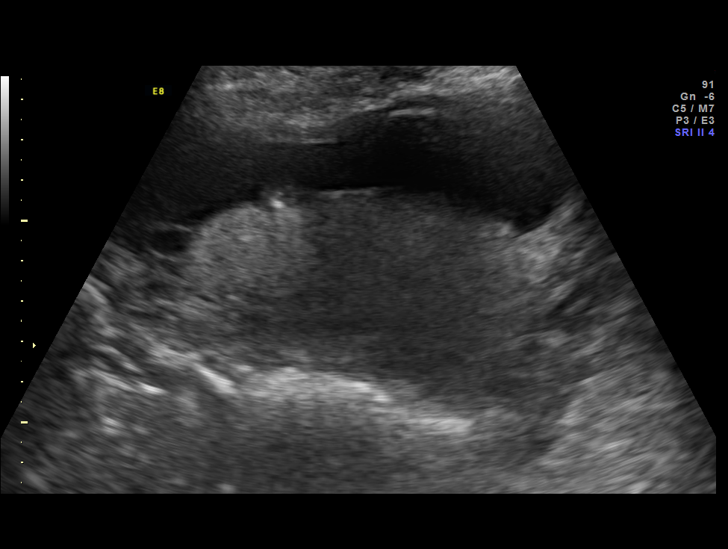
[im 8/44]
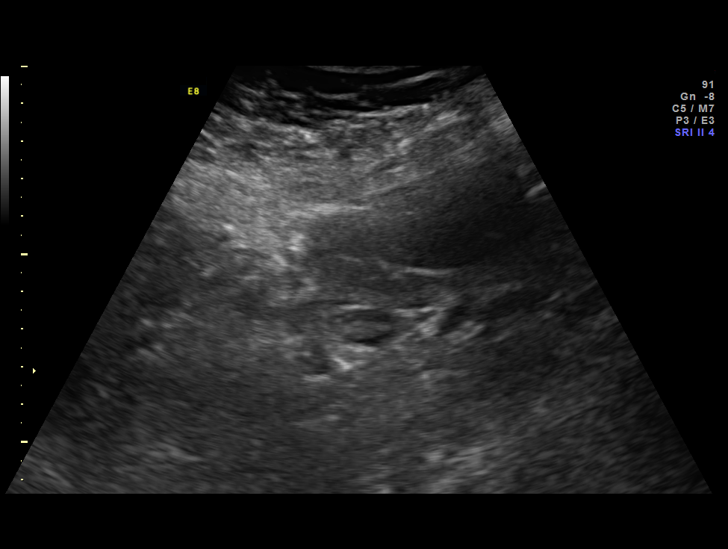
[im 12/44]
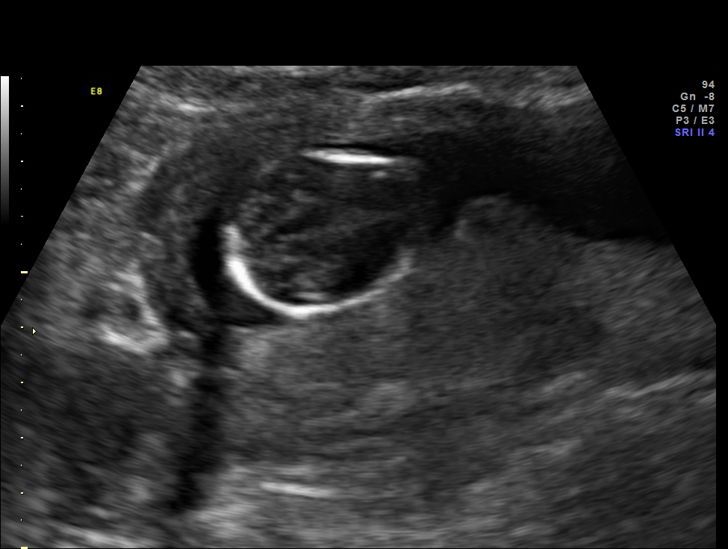
[im 15/44]
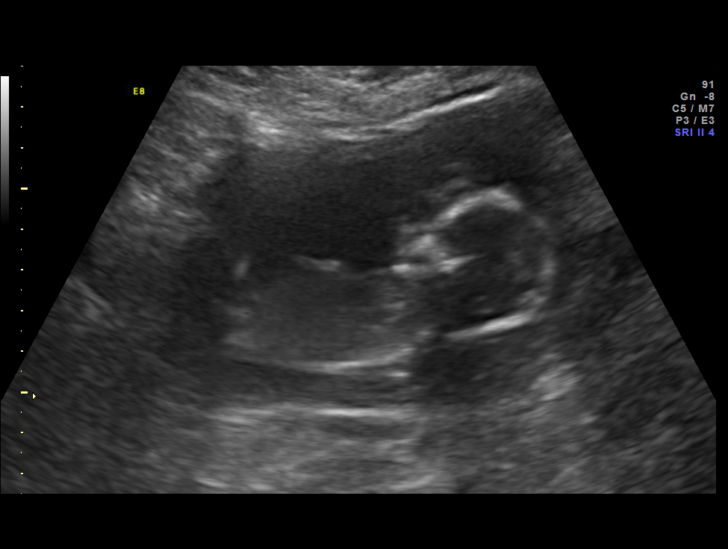
[im 18/44]
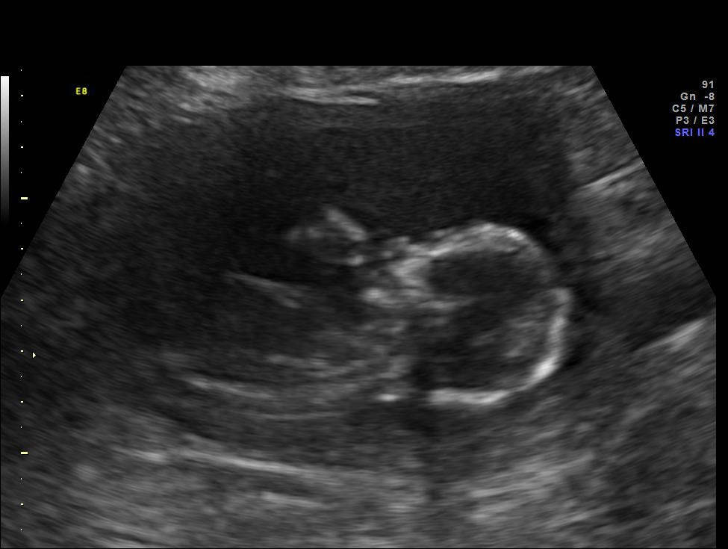
[im 23/44]
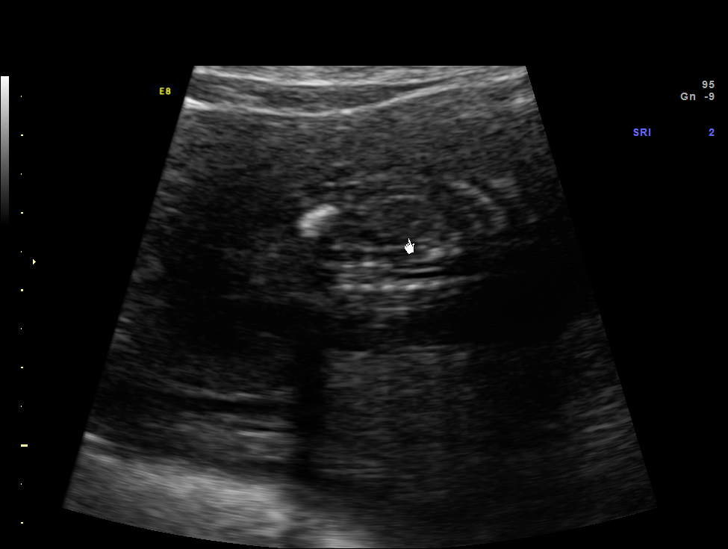
[im 26/44]
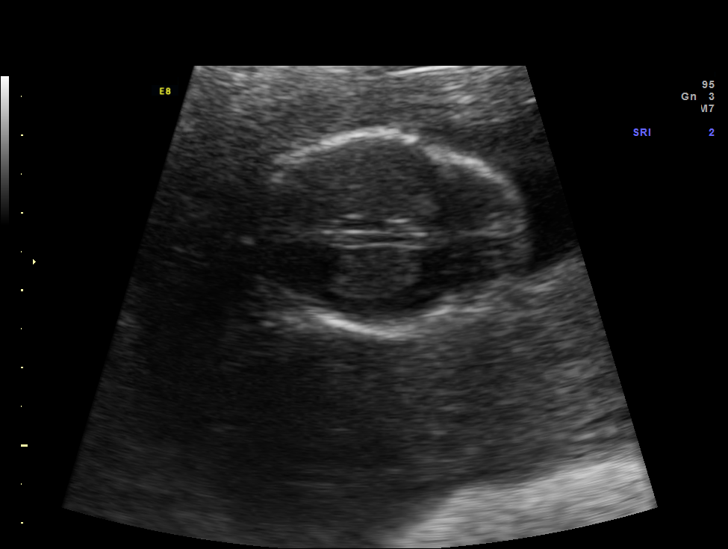
[im 29/44]
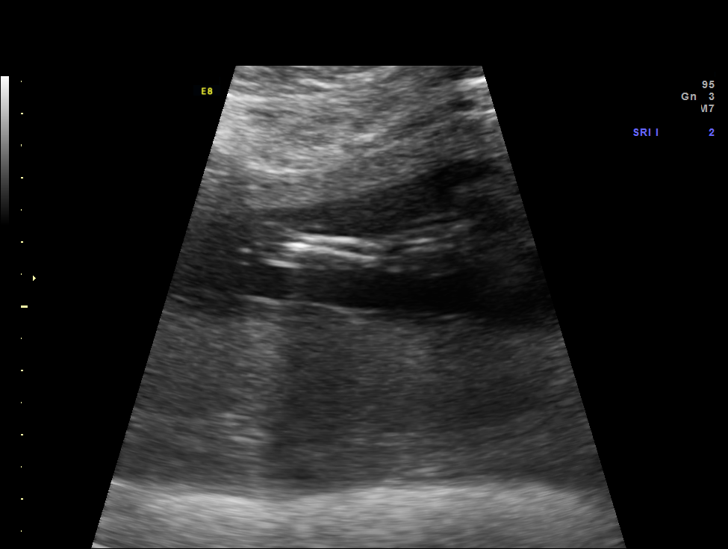
[im 32/44]
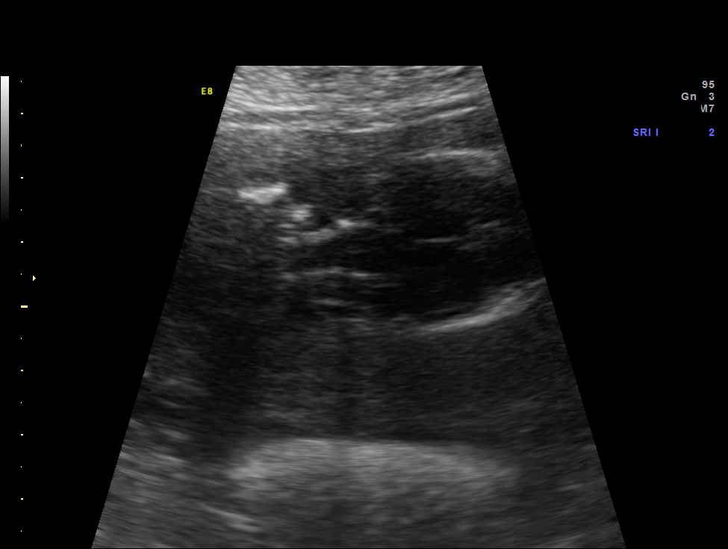
[im 36/44]
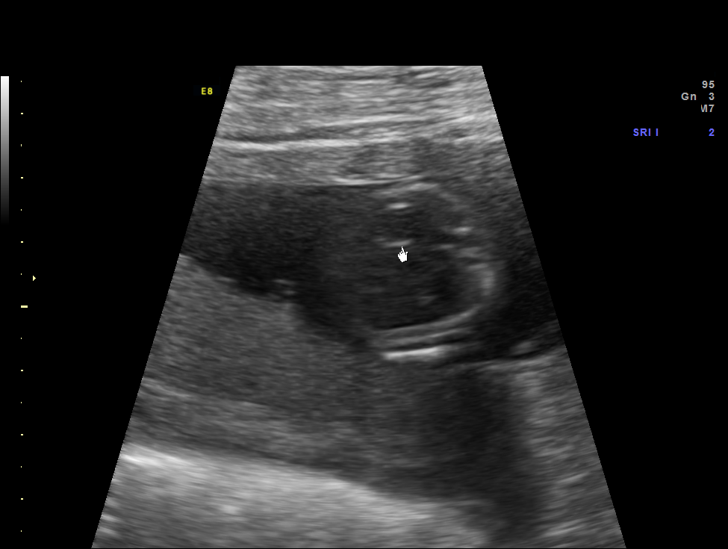
[im 39/44]
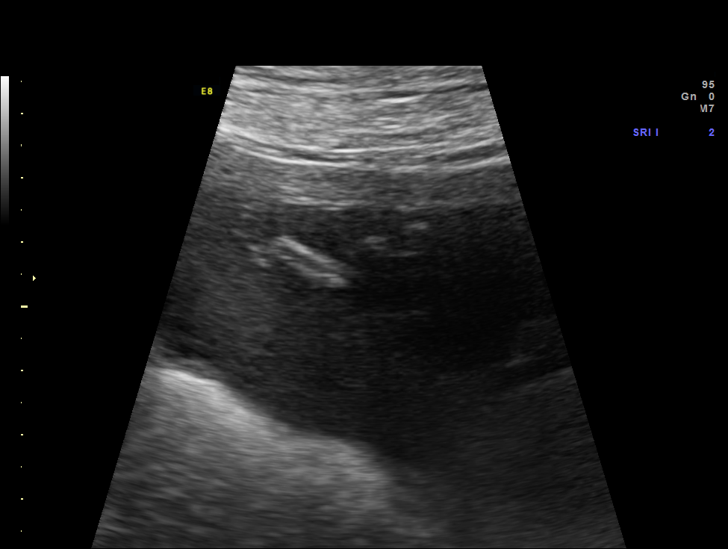
[im 42/44]
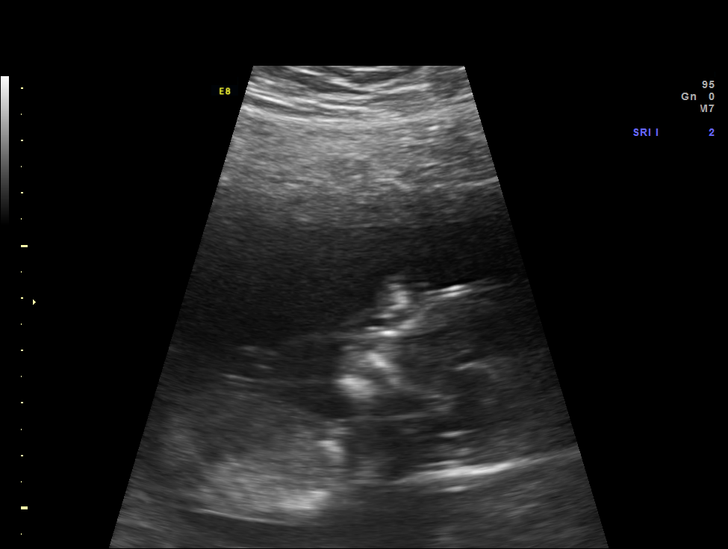

[13 of 28 positions shown; findings below may reference images not displayed]

OBSTETRICS REPORT
                      (Signed Final 01/20/2013 [DATE])

Service(s) Provided

 US OB COMP LESS 14 WKS                                76801.0
Indications

 Advanced maternal age (AMA), Multigravida
 Previous cesarean section
Fetal Evaluation

 Num Of Fetuses:    1
 Fetal Heart Rate:  169                          bpm
 Cardiac Activity:  Observed
 Presentation:      Cephalic
 Placenta:          Posterior
 P. Cord            Visualized
 Insertion:

 Amniotic Fluid
 AFI FV:      Subjectively within normal limits
Biometry

 BPD:     26.1  mm     G. Age:  14w 4d
Gestational Age

 LMP:           13w 6d        Date:  10/15/12                 EDD:   07/22/13
 U/S Today:     14w 4d                                        EDD:   07/17/13
 Best:          14w 5d     Det. By:  Early Ultrasound         EDD:   07/16/13
                                     (12/10/12)
Anatomy

 Cranium:          Appears normal         Abdominal Wall:   Appears nml (cord
                                                            insert, abd wall)
 Choroid Plexus:   Appears normal         Cord Vessels:     Appears normal (3
                                                            vessel cord)
 Stomach:          Appears normal, left   Bladder:          Appears normal
                   sided
Cervix Uterus Adnexa

 Cervix:       Normal appearance by transabdominal scan.
 Left Ovary:    Not visualized. No adnexal mass visualized.
 Right Ovary:   Within normal limits.
Impression

 IUP at 14+5 weeks
 No gross abnormalities identified
 NT measurement was within normal limits for this GA; NB
 present
 Normal amniotic fluid volume
 Measurements consistent with early US

 CRL was too large to perform first trimester screening. All of
 her prenatal testing and pregnancy management options
 were reviewed. After careful consideration, she chose to have
 cell free fetal DNA screening ([facility]).

 We will forward the results to you as soon as they are
 available ([facility]).
Recommendations

 Offer [facility] in the second trimester for ONTD screening
 Offer detailed U/S by 18 weeks

 questions or concerns.

## 2013-05-06 ENCOUNTER — Ambulatory Visit (HOSPITAL_COMMUNITY): Payer: BC Managed Care – PPO

## 2013-05-07 ENCOUNTER — Encounter (HOSPITAL_COMMUNITY): Payer: Self-pay

## 2013-05-07 ENCOUNTER — Ambulatory Visit (HOSPITAL_COMMUNITY)
Admission: RE | Admit: 2013-05-07 | Discharge: 2013-05-07 | Disposition: A | Discharge status: Home / Self Care | Payer: BC Managed Care – PPO | Source: Ambulatory Visit | Attending: Obstetrics and Gynecology | Admitting: Obstetrics and Gynecology

## 2013-05-07 DIAGNOSIS — O350XX0 Maternal care for (suspected) central nervous system malformation in fetus, not applicable or unspecified: Secondary | ICD-10-CM | POA: Insufficient documentation

## 2013-05-07 DIAGNOSIS — O09529 Supervision of elderly multigravida, unspecified trimester: Secondary | ICD-10-CM | POA: Insufficient documentation

## 2013-05-07 DIAGNOSIS — O4400 Placenta previa specified as without hemorrhage, unspecified trimester: Secondary | ICD-10-CM | POA: Insufficient documentation

## 2013-05-07 DIAGNOSIS — O3500X0 Maternal care for (suspected) central nervous system malformation or damage in fetus, unspecified, not applicable or unspecified: Secondary | ICD-10-CM | POA: Insufficient documentation

## 2013-05-07 DIAGNOSIS — Z0489 Encounter for examination and observation for other specified reasons: Secondary | ICD-10-CM

## 2013-05-07 DIAGNOSIS — O99810 Abnormal glucose complicating pregnancy: Secondary | ICD-10-CM | POA: Insufficient documentation

## 2013-06-09 ENCOUNTER — Other Ambulatory Visit: Payer: Self-pay | Admitting: Obstetrics and Gynecology

## 2013-06-09 DIAGNOSIS — O350XX1 Maternal care for (suspected) central nervous system malformation in fetus, fetus 1: Secondary | ICD-10-CM

## 2013-06-09 DIAGNOSIS — O24919 Unspecified diabetes mellitus in pregnancy, unspecified trimester: Secondary | ICD-10-CM

## 2013-06-09 DIAGNOSIS — O09529 Supervision of elderly multigravida, unspecified trimester: Secondary | ICD-10-CM

## 2013-06-11 ENCOUNTER — Other Ambulatory Visit: Payer: Self-pay | Admitting: Obstetrics and Gynecology

## 2013-06-11 ENCOUNTER — Ambulatory Visit (HOSPITAL_COMMUNITY)
Admission: RE | Admit: 2013-06-11 | Discharge: 2013-06-11 | Disposition: A | Discharge status: Home / Self Care | Payer: BC Managed Care – PPO | Source: Ambulatory Visit | Attending: Obstetrics and Gynecology | Admitting: Obstetrics and Gynecology

## 2013-06-11 DIAGNOSIS — O4403 Placenta previa specified as without hemorrhage, third trimester: Secondary | ICD-10-CM

## 2013-06-11 DIAGNOSIS — O24919 Unspecified diabetes mellitus in pregnancy, unspecified trimester: Secondary | ICD-10-CM

## 2013-06-11 DIAGNOSIS — O3500X0 Maternal care for (suspected) central nervous system malformation or damage in fetus, unspecified, not applicable or unspecified: Secondary | ICD-10-CM | POA: Insufficient documentation

## 2013-06-11 DIAGNOSIS — O4400 Placenta previa specified as without hemorrhage, unspecified trimester: Secondary | ICD-10-CM | POA: Insufficient documentation

## 2013-06-11 DIAGNOSIS — O350XX0 Maternal care for (suspected) central nervous system malformation in fetus, not applicable or unspecified: Secondary | ICD-10-CM | POA: Insufficient documentation

## 2013-06-11 DIAGNOSIS — O09529 Supervision of elderly multigravida, unspecified trimester: Secondary | ICD-10-CM | POA: Insufficient documentation

## 2013-06-11 DIAGNOSIS — O350XX1 Maternal care for (suspected) central nervous system malformation in fetus, fetus 1: Secondary | ICD-10-CM

## 2013-06-11 DIAGNOSIS — O99810 Abnormal glucose complicating pregnancy: Secondary | ICD-10-CM | POA: Insufficient documentation

## 2013-06-28 ENCOUNTER — Inpatient Hospital Stay (HOSPITAL_COMMUNITY)
Admission: AD | Admit: 2013-06-28 | Discharge: 2013-06-28 | Disposition: A | Discharge status: Home / Self Care | Payer: BC Managed Care – PPO | Source: Ambulatory Visit | Attending: Obstetrics and Gynecology | Admitting: Obstetrics and Gynecology

## 2013-06-28 ENCOUNTER — Encounter (HOSPITAL_COMMUNITY): Payer: Self-pay | Admitting: *Deleted

## 2013-06-28 DIAGNOSIS — O4700 False labor before 37 completed weeks of gestation, unspecified trimester: Secondary | ICD-10-CM | POA: Insufficient documentation

## 2013-06-28 HISTORY — DX: Gestational diabetes mellitus in pregnancy, unspecified control: O24.419

## 2013-06-28 NOTE — MAU Note (Signed)
Pt in c/o possible contractions. Reports pain since last night.  Mainly feels pain at incision site,  States it is more sharp than crampy.  Denies any bleeding or lof. + FM.

## 2013-06-29 ENCOUNTER — Encounter (HOSPITAL_COMMUNITY)
Admission: AD | Disposition: A | Discharge status: Home / Self Care | Payer: Self-pay | Source: Ambulatory Visit | Attending: Obstetrics and Gynecology

## 2013-06-29 ENCOUNTER — Other Ambulatory Visit: Payer: Self-pay | Admitting: Obstetrics and Gynecology

## 2013-06-29 ENCOUNTER — Encounter (HOSPITAL_COMMUNITY): Payer: Self-pay | Admitting: *Deleted

## 2013-06-29 ENCOUNTER — Inpatient Hospital Stay (HOSPITAL_COMMUNITY): Payer: BC Managed Care – PPO | Admitting: Anesthesiology

## 2013-06-29 ENCOUNTER — Encounter (HOSPITAL_COMMUNITY): Payer: Self-pay | Admitting: Anesthesiology

## 2013-06-29 ENCOUNTER — Inpatient Hospital Stay (HOSPITAL_COMMUNITY)
Admission: AD | Admit: 2013-06-29 | Discharge: 2013-07-02 | DRG: 370 | Disposition: A | Discharge status: Home / Self Care | Payer: BC Managed Care – PPO | Source: Ambulatory Visit | Attending: Obstetrics and Gynecology | Admitting: Obstetrics and Gynecology

## 2013-06-29 DIAGNOSIS — O26619 Liver and biliary tract disorders in pregnancy, unspecified trimester: Secondary | ICD-10-CM | POA: Diagnosis present

## 2013-06-29 DIAGNOSIS — O34219 Maternal care for unspecified type scar from previous cesarean delivery: Principal | ICD-10-CM | POA: Diagnosis present

## 2013-06-29 DIAGNOSIS — Z98891 History of uterine scar from previous surgery: Secondary | ICD-10-CM

## 2013-06-29 DIAGNOSIS — Z22330 Carrier of Group B streptococcus: Secondary | ICD-10-CM

## 2013-06-29 DIAGNOSIS — O99814 Abnormal glucose complicating childbirth: Secondary | ICD-10-CM | POA: Diagnosis present

## 2013-06-29 DIAGNOSIS — O99892 Other specified diseases and conditions complicating childbirth: Secondary | ICD-10-CM | POA: Diagnosis present

## 2013-06-29 DIAGNOSIS — O09529 Supervision of elderly multigravida, unspecified trimester: Secondary | ICD-10-CM | POA: Diagnosis present

## 2013-06-29 LAB — CBC
MCH: 26.5 pg (ref 26.0–34.0)
MCHC: 33 g/dL (ref 30.0–36.0)
Platelets: 206 10*3/uL (ref 150–400)
RBC: 4.19 MIL/uL (ref 3.87–5.11)

## 2013-06-29 LAB — COMPREHENSIVE METABOLIC PANEL
ALT: 10 U/L (ref 0–35)
AST: 19 U/L (ref 0–37)
Calcium: 9.4 mg/dL (ref 8.4–10.5)
GFR calc Af Amer: >90 mL/min (ref >90)
Sodium: 135 mEq/L (ref 135–145)
Total Protein: 6.3 g/dL (ref 6.0–8.3)

## 2013-06-29 LAB — GLUCOSE, CAPILLARY: Glucose-Capillary: 94 mg/dL (ref 70–99)

## 2013-06-29 LAB — TYPE AND SCREEN
ABO/RH(D): A POS
Antibody Screen: NEGATIVE

## 2013-06-29 SURGERY — Surgical Case
Anesthesia: Spinal | Site: Abdomen | Wound class: Clean Contaminated

## 2013-06-29 MED ORDER — DIPHENHYDRAMINE HCL 50 MG/ML IJ SOLN: 25.0000 mg | INTRAMUSCULAR | Status: DC | PRN

## 2013-06-29 MED ORDER — OXYTOCIN 10 UNIT/ML IJ SOLN
INTRAMUSCULAR | Status: AC
  Filled 2013-06-29: qty 4

## 2013-06-29 MED ORDER — PENICILLIN G POTASSIUM 5000000 UNITS IJ SOLR
5.0000 10*6.[IU] | Freq: Once | INTRAVENOUS | Status: AC
  Administered 2013-06-29: 5 10*6.[IU] via INTRAVENOUS
  Filled 2013-06-29: qty 5

## 2013-06-29 MED ORDER — LACTATED RINGERS IV SOLN
INTRAVENOUS | Status: DC
  Administered 2013-06-29 (×3): via INTRAVENOUS

## 2013-06-29 MED ORDER — SENNOSIDES-DOCUSATE SODIUM 8.6-50 MG PO TABS
2.0000 | ORAL_TABLET | Freq: Every day | ORAL | Status: DC
  Administered 2013-06-30 – 2013-07-01 (×2): 2 via ORAL

## 2013-06-29 MED ORDER — LANOLIN HYDROUS EX OINT: 1.0000 "application " | TOPICAL_OINTMENT | CUTANEOUS | Status: DC | PRN

## 2013-06-29 MED ORDER — ONDANSETRON HCL 4 MG/2ML IJ SOLN
INTRAMUSCULAR | Status: AC
  Filled 2013-06-29: qty 2

## 2013-06-29 MED ORDER — DIBUCAINE 1 % RE OINT: 1.0000 "application " | TOPICAL_OINTMENT | RECTAL | Status: DC | PRN

## 2013-06-29 MED ORDER — FENTANYL CITRATE 0.05 MG/ML IJ SOLN
INTRAMUSCULAR | Status: AC
  Administered 2013-06-29: 50 ug via INTRAVENOUS
  Filled 2013-06-29: qty 2

## 2013-06-29 MED ORDER — LACTATED RINGERS IV SOLN
INTRAVENOUS | Status: DC
  Administered 2013-06-30 (×2): via INTRAVENOUS

## 2013-06-29 MED ORDER — DIPHENHYDRAMINE HCL 25 MG PO CAPS
25.0000 mg | ORAL_CAPSULE | ORAL | Status: DC | PRN
  Administered 2013-06-30: 25 mg via ORAL
  Filled 2013-06-29 (×2): qty 1

## 2013-06-29 MED ORDER — MORPHINE SULFATE 0.5 MG/ML IJ SOLN
INTRAMUSCULAR | Status: AC
  Filled 2013-06-29: qty 10

## 2013-06-29 MED ORDER — ZOLPIDEM TARTRATE 5 MG PO TABS
5.0000 mg | ORAL_TABLET | Freq: Every evening | ORAL | Status: DC | PRN
Start: 2013-06-29 — End: 2013-07-02

## 2013-06-29 MED ORDER — PHENYLEPHRINE 40 MCG/ML (10ML) SYRINGE FOR IV PUSH (FOR BLOOD PRESSURE SUPPORT)
PREFILLED_SYRINGE | INTRAVENOUS | Status: AC
  Filled 2013-06-29: qty 5

## 2013-06-29 MED ORDER — FENTANYL CITRATE 0.05 MG/ML IJ SOLN
INTRAMUSCULAR | Status: DC | PRN
  Administered 2013-06-29: 25 ug via INTRATHECAL

## 2013-06-29 MED ORDER — SCOPOLAMINE 1 MG/3DAYS TD PT72: 1.0000 | MEDICATED_PATCH | Freq: Once | TRANSDERMAL | Status: DC

## 2013-06-29 MED ORDER — OXYTOCIN 40 UNITS IN LACTATED RINGERS INFUSION - SIMPLE MED
INTRAVENOUS | Status: DC | PRN
  Administered 2013-06-29: 40 [IU] via INTRAVENOUS

## 2013-06-29 MED ORDER — SIMETHICONE 80 MG PO CHEW
80.0000 mg | CHEWABLE_TABLET | Freq: Three times a day (TID) | ORAL | Status: DC
  Administered 2013-06-30 – 2013-07-02 (×9): 80 mg via ORAL

## 2013-06-29 MED ORDER — EPHEDRINE 5 MG/ML INJ
INTRAVENOUS | Status: AC
  Filled 2013-06-29: qty 10

## 2013-06-29 MED ORDER — FENTANYL CITRATE 0.05 MG/ML IJ SOLN
INTRAMUSCULAR | Status: AC
  Filled 2013-06-29: qty 2

## 2013-06-29 MED ORDER — ONDANSETRON HCL 4 MG/2ML IJ SOLN
INTRAMUSCULAR | Status: DC | PRN
  Administered 2013-06-29: 4 mg via INTRAVENOUS

## 2013-06-29 MED ORDER — NALBUPHINE SYRINGE 5 MG/0.5 ML
5.0000 mg | INJECTION | INTRAMUSCULAR | Status: DC | PRN
  Administered 2013-06-30 (×2): 5 mg via INTRAVENOUS
  Filled 2013-06-29 (×2): qty 1

## 2013-06-29 MED ORDER — WITCH HAZEL-GLYCERIN EX PADS: 1.0000 "application " | MEDICATED_PAD | CUTANEOUS | Status: DC | PRN

## 2013-06-29 MED ORDER — TETANUS-DIPHTH-ACELL PERTUSSIS 5-2.5-18.5 LF-MCG/0.5 IM SUSP
0.5000 mL | Freq: Once | INTRAMUSCULAR | Status: AC
  Administered 2013-06-30: 0.5 mL via INTRAMUSCULAR
  Filled 2013-06-29: qty 0.5

## 2013-06-29 MED ORDER — NALOXONE HCL 0.4 MG/ML IJ SOLN
0.4000 mg | INTRAMUSCULAR | Status: DC | PRN
Start: 2013-06-29 — End: 2013-07-02

## 2013-06-29 MED ORDER — CEFAZOLIN SODIUM-DEXTROSE 2-3 GM-% IV SOLR
INTRAVENOUS | Status: DC | PRN
  Administered 2013-06-29: 2 g via INTRAVENOUS

## 2013-06-29 MED ORDER — OXYCODONE-ACETAMINOPHEN 5-325 MG PO TABS
1.0000 | ORAL_TABLET | ORAL | Status: DC | PRN
  Administered 2013-06-30: 1 via ORAL
  Administered 2013-06-30 – 2013-07-02 (×9): 2 via ORAL
  Filled 2013-06-29 (×9): qty 2
  Filled 2013-06-29: qty 1

## 2013-06-29 MED ORDER — MENTHOL 3 MG MT LOZG: 1.0000 | LOZENGE | OROMUCOSAL | Status: DC | PRN

## 2013-06-29 MED ORDER — NALOXONE HCL 1 MG/ML IJ SOLN
1.0000 ug/kg/h | INTRAVENOUS | Status: DC | PRN
  Filled 2013-06-29: qty 2

## 2013-06-29 MED ORDER — METOCLOPRAMIDE HCL 5 MG/ML IJ SOLN
10.0000 mg | Freq: Three times a day (TID) | INTRAMUSCULAR | Status: DC | PRN
  Administered 2013-06-29: 10 mg via INTRAVENOUS

## 2013-06-29 MED ORDER — KETOROLAC TROMETHAMINE 30 MG/ML IJ SOLN
30.0000 mg | Freq: Four times a day (QID) | INTRAMUSCULAR | Status: AC | PRN
  Administered 2013-06-29: 30 mg via INTRAMUSCULAR

## 2013-06-29 MED ORDER — BUPIVACAINE IN DEXTROSE 0.75-8.25 % IT SOLN
INTRATHECAL | Status: DC | PRN
  Administered 2013-06-29: 1.6 mL via INTRATHECAL

## 2013-06-29 MED ORDER — SCOPOLAMINE 1 MG/3DAYS TD PT72
MEDICATED_PATCH | TRANSDERMAL | Status: AC
  Filled 2013-06-29: qty 1

## 2013-06-29 MED ORDER — CITRIC ACID-SODIUM CITRATE 334-500 MG/5ML PO SOLN
ORAL | Status: AC
  Administered 2013-06-29: 30 mL
  Filled 2013-06-29: qty 15

## 2013-06-29 MED ORDER — FENTANYL CITRATE 0.05 MG/ML IJ SOLN
25.0000 ug | INTRAMUSCULAR | Status: DC | PRN
  Administered 2013-06-29: 50 ug via INTRAVENOUS

## 2013-06-29 MED ORDER — SODIUM CHLORIDE 0.9 % IJ SOLN: 3.0000 mL | INTRAMUSCULAR | Status: DC | PRN

## 2013-06-29 MED ORDER — KETOROLAC TROMETHAMINE 30 MG/ML IJ SOLN
30.0000 mg | Freq: Four times a day (QID) | INTRAMUSCULAR | Status: AC | PRN
  Administered 2013-06-30: 30 mg via INTRAVENOUS
  Filled 2013-06-29: qty 1

## 2013-06-29 MED ORDER — FENTANYL CITRATE 0.05 MG/ML IJ SOLN
INTRAMUSCULAR | Status: DC | PRN
  Administered 2013-06-29: 25 ug via INTRAVENOUS
  Administered 2013-06-29: 50 ug via INTRAVENOUS

## 2013-06-29 MED ORDER — ONDANSETRON HCL 4 MG/2ML IJ SOLN
4.0000 mg | INTRAMUSCULAR | Status: DC | PRN
  Administered 2013-06-29: 4 mg via INTRAVENOUS

## 2013-06-29 MED ORDER — DIPHENHYDRAMINE HCL 25 MG PO CAPS
25.0000 mg | ORAL_CAPSULE | Freq: Four times a day (QID) | ORAL | Status: DC | PRN
  Administered 2013-06-30: 25 mg via ORAL

## 2013-06-29 MED ORDER — CEFAZOLIN SODIUM-DEXTROSE 2-3 GM-% IV SOLR
2.0000 g | Freq: Once | INTRAVENOUS | Status: DC
  Filled 2013-06-29: qty 50

## 2013-06-29 MED ORDER — PENICILLIN G POTASSIUM 5000000 UNITS IJ SOLR
2.5000 10*6.[IU] | INTRAVENOUS | Status: DC
  Filled 2013-06-29 (×6): qty 2.5

## 2013-06-29 MED ORDER — MEASLES, MUMPS & RUBELLA VAC ~~LOC~~ INJ
0.5000 mL | INJECTION | Freq: Once | SUBCUTANEOUS | Status: DC
  Filled 2013-06-29: qty 0.5

## 2013-06-29 MED ORDER — PHENYLEPHRINE 40 MCG/ML (10ML) SYRINGE FOR IV PUSH (FOR BLOOD PRESSURE SUPPORT)
PREFILLED_SYRINGE | INTRAVENOUS | Status: AC
  Filled 2013-06-29: qty 15

## 2013-06-29 MED ORDER — ONDANSETRON HCL 4 MG PO TABS: 4.0000 mg | ORAL_TABLET | ORAL | Status: DC | PRN

## 2013-06-29 MED ORDER — METHYLERGONOVINE MALEATE 0.2 MG PO TABS: 0.2000 mg | ORAL_TABLET | ORAL | Status: DC | PRN

## 2013-06-29 MED ORDER — NALBUPHINE SYRINGE 5 MG/0.5 ML
5.0000 mg | INJECTION | INTRAMUSCULAR | Status: DC | PRN
  Filled 2013-06-29: qty 1

## 2013-06-29 MED ORDER — IBUPROFEN 600 MG PO TABS
600.0000 mg | ORAL_TABLET | Freq: Four times a day (QID) | ORAL | Status: DC
  Administered 2013-06-30 – 2013-07-02 (×9): 600 mg via ORAL
  Filled 2013-06-29 (×9): qty 1

## 2013-06-29 MED ORDER — MEPERIDINE HCL 25 MG/ML IJ SOLN
INTRAMUSCULAR | Status: AC
  Filled 2013-06-29: qty 1

## 2013-06-29 MED ORDER — ONDANSETRON HCL 4 MG/2ML IJ SOLN
4.0000 mg | Freq: Three times a day (TID) | INTRAMUSCULAR | Status: DC | PRN
  Filled 2013-06-29: qty 2

## 2013-06-29 MED ORDER — PHENYLEPHRINE HCL 10 MG/ML IJ SOLN
INTRAMUSCULAR | Status: DC | PRN
  Administered 2013-06-29 (×2): 120 ug via INTRAVENOUS
  Administered 2013-06-29: 80 ug via INTRAVENOUS
  Administered 2013-06-29 (×2): 120 ug via INTRAVENOUS
  Administered 2013-06-29: 80 ug via INTRAVENOUS
  Administered 2013-06-29: 40 ug via INTRAVENOUS
  Administered 2013-06-29: 120 ug via INTRAVENOUS

## 2013-06-29 MED ORDER — METHYLERGONOVINE MALEATE 0.2 MG/ML IJ SOLN: 0.2000 mg | INTRAMUSCULAR | Status: DC | PRN

## 2013-06-29 MED ORDER — DIPHENHYDRAMINE HCL 50 MG/ML IJ SOLN: 12.5000 mg | INTRAMUSCULAR | Status: DC | PRN

## 2013-06-29 MED ORDER — SIMETHICONE 80 MG PO CHEW: 80.0000 mg | CHEWABLE_TABLET | ORAL | Status: DC | PRN

## 2013-06-29 MED ORDER — MORPHINE SULFATE (PF) 0.5 MG/ML IJ SOLN
INTRAMUSCULAR | Status: DC | PRN
  Administered 2013-06-29: .15 mg via INTRATHECAL

## 2013-06-29 MED ORDER — CEFAZOLIN SODIUM-DEXTROSE 2-3 GM-% IV SOLR
2.0000 g | INTRAVENOUS | Status: DC
  Filled 2013-06-29: qty 50

## 2013-06-29 MED ORDER — MEPERIDINE HCL 25 MG/ML IJ SOLN
INTRAMUSCULAR | Status: DC | PRN
  Administered 2013-06-29 (×2): 12.5 mg via INTRAVENOUS

## 2013-06-29 MED ORDER — MEPERIDINE HCL 25 MG/ML IJ SOLN: 6.2500 mg | INTRAMUSCULAR | Status: DC | PRN

## 2013-06-29 MED ORDER — OXYTOCIN 40 UNITS IN LACTATED RINGERS INFUSION - SIMPLE MED: 62.5000 mL/h | INTRAVENOUS | Status: AC

## 2013-06-29 MED ORDER — PRENATAL MULTIVITAMIN CH
1.0000 | ORAL_TABLET | Freq: Every day | ORAL | Status: DC
  Administered 2013-06-30 – 2013-07-02 (×3): 1 via ORAL
  Filled 2013-06-29 (×3): qty 1

## 2013-06-29 SURGICAL SUPPLY — 40 items
BARRIER ADHS 3X4 INTERCEED (GAUZE/BANDAGES/DRESSINGS) ×2 IMPLANT
BENZOIN TINCTURE PRP APPL 2/3 (GAUZE/BANDAGES/DRESSINGS) ×2 IMPLANT
CLAMP CORD UMBIL (MISCELLANEOUS) IMPLANT
CLOTH BEACON ORANGE TIMEOUT ST (SAFETY) ×2 IMPLANT
DERMABOND ADVANCED (GAUZE/BANDAGES/DRESSINGS)
DERMABOND ADVANCED .7 DNX12 (GAUZE/BANDAGES/DRESSINGS) IMPLANT
DRAPE LG THREE QUARTER DISP (DRAPES) ×2 IMPLANT
DRSG OPSITE 6X11 MED (GAUZE/BANDAGES/DRESSINGS) ×2 IMPLANT
DRSG OPSITE POSTOP 4X10 (GAUZE/BANDAGES/DRESSINGS) ×2 IMPLANT
DURAPREP 26ML APPLICATOR (WOUND CARE) ×2 IMPLANT
ELECT REM PT RETURN 9FT ADLT (ELECTROSURGICAL) ×2
ELECTRODE REM PT RTRN 9FT ADLT (ELECTROSURGICAL) ×1 IMPLANT
EXTRACTOR VACUUM BELL STYLE (SUCTIONS) IMPLANT
GLOVE BIO SURGEON STRL SZ7 (GLOVE) ×2 IMPLANT
GLOVE BIOGEL PI IND STRL 7.0 (GLOVE) ×1 IMPLANT
GLOVE BIOGEL PI INDICATOR 7.0 (GLOVE) ×1
GOWN PREVENTION PLUS XLARGE (GOWN DISPOSABLE) IMPLANT
GOWN STRL REIN XL XLG (GOWN DISPOSABLE) ×4 IMPLANT
KIT ABG SYR 3ML LUER SLIP (SYRINGE) IMPLANT
NEEDLE HYPO 25X5/8 SAFETYGLIDE (NEEDLE) IMPLANT
NS IRRIG 1000ML POUR BTL (IV SOLUTION) ×2 IMPLANT
PACK C SECTION WH (CUSTOM PROCEDURE TRAY) ×2 IMPLANT
PAD ABD 7.5X8 STRL (GAUZE/BANDAGES/DRESSINGS) ×2 IMPLANT
PAD OB MATERNITY 4.3X12.25 (PERSONAL CARE ITEMS) ×2 IMPLANT
RTRCTR C-SECT PINK 25CM LRG (MISCELLANEOUS) ×2 IMPLANT
STRIP CLOSURE SKIN 1/2X4 (GAUZE/BANDAGES/DRESSINGS) ×2 IMPLANT
SUT CHROMIC 0 CTX 36 (SUTURE) ×6 IMPLANT
SUT PLAIN 2 0 (SUTURE) ×1
SUT PLAIN 2 0 XLH (SUTURE) IMPLANT
SUT PLAIN ABS 2-0 54XMFL TIE (SUTURE) IMPLANT
SUT PLAIN ABS 2-0 CT1 27XMFL (SUTURE) ×1 IMPLANT
SUT VIC AB 0 CT1 27 (SUTURE) ×2
SUT VIC AB 0 CT1 27XBRD ANBCTR (SUTURE) ×2 IMPLANT
SUT VIC AB 4-0 KS 27 (SUTURE) ×2 IMPLANT
SUT VICRYL 0 TIES 12 18 (SUTURE) ×2 IMPLANT
TAPE CLOTH SURG 4X10 WHT LF (GAUZE/BANDAGES/DRESSINGS) ×2 IMPLANT
TOWEL OR 17X24 6PK STRL BLUE (TOWEL DISPOSABLE) ×6 IMPLANT
TRAY FOLEY CATH 14FR (SET/KITS/TRAYS/PACK) ×2 IMPLANT
WATER STERILE IRR 1000ML POUR (IV SOLUTION) IMPLANT
YANKAUER SUCT BULB TIP NO VENT (SUCTIONS) ×2 IMPLANT

## 2013-06-29 NOTE — Anesthesia Procedure Notes (Signed)
Spinal  Patient location during procedure: OR Start time: 06/29/2013 6:50 PM Staffing Anesthesiologist: Loveda Colaizzi A. Performed by: anesthesiologist  Preanesthetic Checklist Completed: patient identified, site marked, surgical consent, pre-op evaluation, timeout performed, IV checked, risks and benefits discussed and monitors and equipment checked Spinal Block Patient position: sitting Prep: site prepped and draped and DuraPrep Patient monitoring: heart rate, cardiac monitor, continuous pulse ox and blood pressure Approach: midline Location: L3-4 Injection technique: single-shot Needle Needle type: Sprotte  Needle gauge: 24 G Needle length: 9 cm Needle insertion depth: 6 cm Assessment Sensory level: T4 Additional Notes Patient tolerated procedure well. Adequate sensory level.

## 2013-06-29 NOTE — Brief Op Note (Signed)
06/29/2013  8:11 PM  PATIENT:  Misty Gibson  37 y.o. female  PRE-OPERATIVE DIAGNOSIS:  Pregnancy at 36 5/7 weeks, Previous Cesarean Section in Labor, SROM, Declined VBAC, GBS Positive  POST-OPERATIVE DIAGNOSIS:  Same, omental adhesions  PROCEDURE:  Procedure(s): CESAREAN SECTION (N/A) Repeat, LOA  SURGEON:  Surgeon(s) and Role:    * Geryl Rankins, MD - Primary  PHYSICIAN ASSISTANT: None  ASSISTANTS: Technician   ANESTHESIA:   spinal  EBL:  Total I/O In: 600 [I.V.:600] Out: 700 [Urine:100; Blood:600]  BLOOD ADMINISTERED:none  DRAINS: Urinary Catheter (Foley)   LOCAL MEDICATIONS USED:  NONE  SPECIMEN:  No Specimen  DISPOSITION OF SPECIMEN:  N/A  COUNTS:  YES  TOURNIQUET:  * No tourniquets in log *  DICTATION: .Other Dictation: Dictation Number [id number]  PLAN OF CARE: Admit to inpatient   PATIENT DISPOSITION:  PACU - hemodynamically stable.   Delay start of Pharmacological VTE agent (>24hrs) due to surgical blood loss or risk of bleeding: not applicable

## 2013-06-29 NOTE — Progress Notes (Signed)
Patient CBG 64.  Dr. Jean Rosenthal called, with CBG result asked to give patient juice or Soda.  Coke given to patient and apple juice will continue to monitor and retest CBG in 30 minutes. juice.

## 2013-06-29 NOTE — Transfer of Care (Signed)
Immediate Anesthesia Transfer of Care Note  Patient: Misty Gibson  Procedure(s) Performed: Procedure(s): CESAREAN SECTION (N/A)  Patient Location: PACU  Anesthesia Type:Spinal  Level of Consciousness: awake, alert  and oriented  Airway & Oxygen Therapy: Patient Spontanous Breathing  Post-op Assessment: Report given to PACU RN and Post -op Vital signs reviewed and stable  Post vital signs: Reviewed and stable  Complications: No apparent anesthesia complications

## 2013-06-29 NOTE — H&P (Signed)
Misty Gibson is a 37 y.o. female G2 P1001 at 46 5/7weeks c/w an 8 week ultrasound admitted for SROM.  Pt presented today for routine ob visit. Pt grossly ruptured.  Pooling noted.  Cervix 3 cm.  Pt seen in MAU for c/o lower abdominal pain that resolved by the time she arrived at MAU.  Contractions noted at that time but pt did not feel them. Pregnancy has been complicated by Gest DM.  Has been well controlled on Glyburide.  Pt also has a h/o Acute Fatty Liver of Pregnancy.  Had a c-section at 36 weeks due to failed induction.  Pt has been followed by MFM.  Liver enzymes have been normal. Pt was scheduled for a repeat c-section on 8/14.  Pt briefly considered TOLAC but desires to proceed with repeat c-section.  Maternal Medical History:  Reason for admission: Rupture of membranes.   Contractions: Onset was 13-24 hours ago.   Frequency: irregular.   Perceived severity is mild.    Fetal activity: Perceived fetal activity is normal.    Prenatal Complications - Diabetes: gestational. Diabetes is managed by oral agent (monotherapy).      OB History   Grav Para Term Preterm Abortions TAB SAB Ect Mult Living   4    2 1 1   1      Past Medical History  Diagnosis Date  . Gestational diabetes    Past Surgical History  Procedure Laterality Date  . Cesarean section  2009  . Dilation and evacuation  05/26/2012    Procedure: DILATATION AND EVACUATION;  Surgeon: Geryl Rankins, MD;  Location: [facility];  Service: Gynecology;  Laterality: N/A;   Family History: family history is not on file. Social History:  reports that she has never smoked. She has never used smokeless tobacco. She reports that she does not drink alcohol or use illicit drugs.   Prenatal Transfer Tool  Maternal Diabetes: Yes:  Diabetes Type:  Insulin/Medication controlled Genetic Screening: Normal Maternal Ultrasounds/Referrals: Normal Fetal Ultrasounds or other Referrals:  None Maternal Substance Abuse:  No Significant  Maternal Medications:  Meds include: Other: Glyburide Significant Maternal Lab Results:  Lab values include: Group B Strep positive Other Comments:  None  Review of Systems  Constitutional: Negative for fever and chills.  Cardiovascular: Positive for leg swelling.  Gastrointestinal: Positive for abdominal pain.  Genitourinary: Positive for frequency. Negative for dysuria.    Dilation: 3 Exam by:: dr. Dion Body Blood pressure 124/78, pulse 95, temperature 98.1 F (36.7 C), temperature source Oral, resp. rate 18, height 5\' 5"  (1.651 m), weight 104.327 kg (230 lb), last menstrual period 10/15/2012. Maternal Exam:  Uterine Assessment: Contraction strength is mild.  Contraction frequency is irregular.   Abdomen: Fetal presentation: vertex  Introitus: Normal vulva. Normal vagina.  Ferning test: not done.  Amniotic fluid character: clear.  Pelvis: adequate for delivery.   Cervix: Cervix evaluated by digital exam.   3/70/high  Fetal Exam Fetal Monitor Review: Mode: fetoscope.   Variability: moderate (6-25 bpm).   Pattern: accelerations present.    Fetal State Assessment: Category I - tracings are normal.     Physical Exam  Constitutional: She is oriented to person, place, and time. She appears well-developed and well-nourished. No distress.  HENT:  Head: Normocephalic and atraumatic.  Eyes: EOM are normal.  Neck: Normal range of motion. Neck supple.  Cardiovascular: Normal rate, regular rhythm and normal heart sounds.   Respiratory: Effort normal and breath sounds normal. No respiratory distress.  GI: There is  no tenderness.  Musculoskeletal: Normal range of motion. She exhibits edema. She exhibits no tenderness.  Neurological: She is alert and oriented to person, place, and time.  Skin: Skin is warm and dry.  Psychiatric: She has a normal mood and affect.    Prenatal labs: ABO, Rh: --/--/A POS (07/28 1450) Antibody: NEG (07/28 1450) Rubella: Nonimmune (01/06 0000) RPR:  Nonreactive (01/03 0000)  HBsAg: Negative (01/03 0000)  HIV: Non-reactive (01/03 0000)  GBS: Positive (07/28 0000)   Assessment/Plan: Pregnancy at 36 5/7 weeks, SROM, Latent labor, H/o previous c-section. Counseled at length on pros and cons of VBAC vs. Repeat c-section.  Declines VBAC.  Pt counseled on R/B/A.  Consent signed. Start c-section 8 hours after last meal ~1830 or if progresses to active labor or non-reassuring fetal status. Penicillin for GBS prophylaxis while waiting.   Geryl Rankins 06/29/2013, 5:59 PM

## 2013-06-29 NOTE — Anesthesia Preprocedure Evaluation (Signed)
Anesthesia Evaluation  Patient identified by MRN, date of birth, ID band Patient awake    Reviewed: Allergy & Precautions, H&P , NPO status , Patient's Chart, lab work & pertinent test results  Airway Mallampati: III TM Distance: >3 FB Neck ROM: Full    Dental no notable dental hx. (+) Teeth Intact   Pulmonary neg pulmonary ROS,  breath sounds clear to auscultation  Pulmonary exam normal       Cardiovascular negative cardio ROS  Rhythm:Regular Rate:Normal     Neuro/Psych negative neurological ROS  negative psych ROS   GI/Hepatic negative GI ROS, Neg liver ROS,   Endo/Other  diabetes, Well Controlled, Gestational, Oral Hypoglycemic AgentsObesity  Renal/GU negative Renal ROS  negative genitourinary   Musculoskeletal negative musculoskeletal ROS (+)   Abdominal (+) + obese,   Peds  Hematology negative hematology ROS (+)   Anesthesia Other Findings   Reproductive/Obstetrics (+) Pregnancy Previous C/Section                           Anesthesia Physical Anesthesia Plan  ASA: II and emergent  Anesthesia Plan: Spinal   Post-op Pain Management:    Induction:   Airway Management Planned: Natural Airway  Additional Equipment:   Intra-op Plan:   Post-operative Plan:   Informed Consent: I have reviewed the patients History and Physical, chart, labs and discussed the procedure including the risks, benefits and alternatives for the proposed anesthesia with the patient or authorized representative who has indicated his/her understanding and acceptance.   Dental advisory given  Plan Discussed with: CRNA, Anesthesiologist and Surgeon  Anesthesia Plan Comments:         Anesthesia Quick Evaluation

## 2013-06-29 NOTE — Anesthesia Postprocedure Evaluation (Signed)
  Anesthesia Post Note  Patient: Misty Gibson  Procedure(s) Performed: Procedure(s) (LRB): CESAREAN SECTION (N/A)  Anesthesia type: Spinal  Patient location: PACU  Post pain: Pain level controlled  Post assessment: Post-op Vital signs reviewed  Last Vitals:  Filed Vitals:   06/29/13 2030  BP: 132/103  Pulse: 86  Temp:   Resp: 20    Post vital signs: Reviewed  Level of consciousness: awake  Complications: No apparent anesthesia complications

## 2013-06-30 ENCOUNTER — Encounter (HOSPITAL_COMMUNITY): Payer: Self-pay | Admitting: Obstetrics and Gynecology

## 2013-06-30 LAB — CBC
Hemoglobin: 10.5 g/dL — ABNORMAL LOW (ref 12.0–15.0)
MCH: 26.8 pg (ref 26.0–34.0)
MCV: 81.1 fL (ref 78.0–100.0)
Platelets: 173 10*3/uL (ref 150–400)
RBC: 3.92 MIL/uL (ref 3.87–5.11)
WBC: 8.4 10*3/uL (ref 4.0–10.5)

## 2013-06-30 MED ORDER — LACTATED RINGERS IV BOLUS (SEPSIS)
500.0000 mL | Freq: Once | INTRAVENOUS | Status: AC
  Administered 2013-06-30: 500 mL via INTRAVENOUS

## 2013-06-30 NOTE — Progress Notes (Signed)
Subjective: Postpartum Day 1: Cesarean Delivery Patient reports nausea and incisional pain.  Minimal bleeding.  Breast feeding.  Objective: Vital signs in last 24 hours: Temp:  [97.8 F (36.6 C)-98.7 F (37.1 C)] 98 F (36.7 C) (07/29 0800) Pulse Rate:  [64-95] 78 (07/29 0800) Resp:  [13-20] 18 (07/29 0800) BP: (106-132)/(64-109) 113/74 mmHg (07/29 0800) SpO2:  [96 %-100 %] 98 % (07/29 0800) Weight:  [104.327 kg (230 lb)] 104.327 kg (230 lb) (07/28 1419)  Physical Exam:  General: alert and cooperative Lochia: appropriate Uterine Fundus: firm Incision: Dressing clean and dry. DVT Evaluation: SCDs in place.   Recent Labs  06/29/13 1450 06/30/13 0545  HGB 11.1* 10.5*  HCT 33.6* 31.8*    Assessment/Plan: Status post Cesarean section. Doing well postoperatively.  Continue current care. Encouraged ambulation. Anticipate discharge tomorrow afternoon if doing well. Misty Gibson 06/30/2013, 9:15 AM

## 2013-06-30 NOTE — Op Note (Signed)
Misty Gibson, Misty Gibson NO.:  [id number]  MEDICAL RECORD NO.:  [id number]  LOCATION:  9118                          FACILITY:  [facility]  PHYSICIAN:  Pieter Partridge, MD   DATE OF BIRTH:  1976/02/03  DATE OF PROCEDURE:  06/29/2013 DATE OF DISCHARGE:                              OPERATIVE REPORT   PREOPERATIVE DIAGNOSES:  Pregnancy at 72 and 5/7th weeks, previous C- section, in labor.  Spontaneous rupture of membranes, declined vaginal birth after Cesarean and GBS positive.  POSTOPERATIVE DIAGNOSES:  Pregnancy at 36 and 5/7th weeks, previous C- section, in labor.  Spontaneous rupture of membranes, declined vaginal birth after Cesarean and GBS positive.  Omental adhesions.  PROCEDURE:  Repeat low transverse cesarean section with 2 layer closure, lysis of adhesions.  SURGEON:  Pieter Partridge, MD.  PHYSICIAN ASSISTANT:  None.  Technician is another Geophysicist/field seismologist.  ANESTHESIA:  Spinal.  ESTIMATED BLOOD LOSS:  600 mL.  COMPLICATIONS: No complications.  Foley catheter to gravity as well as drains.  SPECIMENS:  None.  DISPOSITION:  To PACU hemodynamically stable.  FINDINGS:  Viable female infant, vertex, clear fluid noted.  Apgars 7 and 9.  Weight is 7 pounds 3 ounces (3260 g).  Normal uterus, fallopian tubes, and ovaries bilaterally.  Dense omental adhesions to peritoneum, hypertrophic scar.  PROCEDURE IN DETAIL:  The patient was identified in the holding area, then she was taken to the operating room with IV running.  She underwent spinal anesthesia without complication.  She was then placed in the dorsal supine position with a leftward tilt.  Foley catheter was placed and she was prepped and draped in a normal sterile fashion.  The previous incision was identified and marked and the entire length of the incision was not used.  A scalpel was used to make the incision. Bovie and hemostat were used to cauterize the subcutaneous vessels.  The subcutaneous  space was then opened down to the fascia with the Bovie. Due to dense adhesions, it was very difficult to delineate fascia, but the fascia was then extended laterally.  The rectus muscles and fascia were adhered so that was slowly carefully rectus muscles were sharply dissected off of the fascia with Kocher clamps, tented up.  The lower portion was significantly easier to dissect.  At the midline, there was a window and there was a thick omental adhesion behind the peritoneum, and when I attempted to open peritoneum, there was a wall of omental adhesions there.  A window was made.  Two Kelly clamps were used to grasp the omentum and freehand ties of 0 Vicryl were used for hemostasis.  The abdomen and the peritoneal window was then stretched avoiding the omental area.  An Alexis retractor was then placed in the abdomen after palpating the anterior abdominal wall, and there were no bowel adhesions noted there.  I attempted to make a bladder flap.  Then, a transverse incision was made on the lower uterine segment, and then extended laterally to the right with the bandage scissors.  The occiput was delivered atraumatically through the incision and delivered.  The shoulders were delivered after the nose and mouth were suctioned.  Cord  was clamped x2 and cut.  The hysterotomy incision was then reapproximated with 0 chromic in a running locked fashion.  The same suture was used for imbrication.  Few series of interrupted sutures were used for adequate hemostasis.  Once the hysterotomy incision was hemostatic, the gutters were cleared of all clots and debris with copious irrigation.  The fallopian tubes, and ovaries were visualized.  Prior to closure of the uterus, the placenta was removed, and all of the clot and debris was removed with a moist laparotomy sponge.  Because of the omental adhesions with the dense omental nick to the peritoneum, peritoneum was not closed, however, the rectus  muscles were reapproximated with 0 chromic and a series of interrupted sutures to protect the omentum.  The fascia was then reapproximated with 0 Vicryl in a continuous running fashion, and the subcutaneous base was very dense.  There was no fatty tissue noted.  It did not even look like adipose.  The patient does have a history of an open incision for 6 months and so once the fascia was reapproximated, the subcutaneous base was released, so there was no puckering.  The subcutaneous space was then reapproximated with 2-0 plain gut and the skin was reapproximated with 4-0 Vicryl on a Keith needle.  There was a window in the middle portion of the incision and that was reapproximated with a buried interrupted stitch.  There was some bleeding noted when that stitch was placed and pressure was applied until hemostasis noted.  Steri-Strips with benzoin and pressure dressing were to be applied.  The patient tolerated the procedure well.  All instrument, sponge, and needle counts were correct x3.  The patient received penicillin upon arrival to the hospital for GBS prophylaxis and then Ancef prior to the procedure.  NICU was available at the delivery and baby went to the newborn nursery for observation due to some grunting.     Pieter Partridge, MD     EBV/MEDQ  D:  06/29/2013  T:  06/30/2013  Job:  [id number]

## 2013-07-01 LAB — BIRTH TISSUE RECOVERY COLLECTION (PLACENTA DONATION)

## 2013-07-01 MED ORDER — MEASLES, MUMPS & RUBELLA VAC ~~LOC~~ INJ
0.5000 mL | INJECTION | Freq: Once | SUBCUTANEOUS | Status: AC
  Administered 2013-07-02: 0.5 mL via SUBCUTANEOUS
  Filled 2013-07-01 (×2): qty 0.5

## 2013-07-01 NOTE — Progress Notes (Signed)
Subjective: Postpartum Day 2: Cesarean Delivery Patient reports she is doing well.  Pain controlled.  Breast feeding.  Objective: Vital signs in last 24 hours: Temp:  [98.3 F (36.8 C)-99.2 F (37.3 C)] 98.3 F (36.8 C) (07/30 0651) Pulse Rate:  [89-94] 94 (07/30 0651) Resp:  [16-18] 17 (07/30 0651) BP: (103-107)/(66-77) 107/77 mmHg (07/30 0651) SpO2:  [95 %-98 %] 96 % (07/30 0651)  Physical Exam:  General: alert, NAD Lochia: appropriate Uterine Fundus:  firm Incision: Dressing Clean DVT Evaluation: No evidence of DVT seen on physical exam. Calf/Ankle edema is present.   Recent Labs  06/29/13 1450 06/30/13 0545  HGB 11.1* 10.5*  HCT 33.6* 31.8*    Assessment/Plan: Status post Cesarean section. Doing well postoperatively.  Continue current care. Encouraged ambulation. Discharge home tomorrow.  Geryl Rankins 07/01/2013, 1:29 PM

## 2013-07-01 NOTE — Progress Notes (Addendum)
CSW referral received to assess pt's history of PP depression.  Pt remembers feeling depressed after the birth of her child in 2009.  She remember crying excessively without a reason & being easily frustrated.  Pt did not seek medical attention, rather relied on support from her mother, mother in-law & friends.  She denies any SI/HI.  Pt's spouse was at the bedside, aware of pt's history & supportive.  CSW provided pt with PP depression literature to read & encouraged her to seek medical attention if PP depression symptoms arise.  She agrees.  Pt & spouse thanked CSW for consult & resources provided.  No barriers to discharge.

## 2013-07-02 LAB — RPR: RPR Ser Ql: NONREACTIVE

## 2013-07-02 MED ORDER — IBUPROFEN 600 MG PO TABS: 600.0000 mg | ORAL_TABLET | Freq: Four times a day (QID) | ORAL | Status: AC

## 2013-07-02 MED ORDER — OXYCODONE-ACETAMINOPHEN 5-325 MG PO TABS: 1.0000 | ORAL_TABLET | ORAL | Status: AC | PRN

## 2013-07-02 NOTE — Discharge Summary (Signed)
Obstetric Discharge Summary Reason for Admission: rupture of membranes Prenatal Procedures: NST and ultrasound Intrapartum Procedures: cesarean: low cervical, transverse Postpartum Procedures: none Complications-Operative and Postpartum: none Hemoglobin  Date Value Range Status  06/30/2013 10.5* 12.0 - 15.0 g/dL Final     HCT  Date Value Range Status  06/30/2013 31.8* 36.0 - 46.0 % Final    Physical Exam:  General: alert, cooperative and no distress Lochia: appropriate Uterine Fundus: firm Incision: healing well DVT Evaluation: No evidence of DVT seen on physical exam. Calf/Ankle edema is present.  Discharge Diagnoses: S/p repeat c-section  Discharge Information: Date: 07/02/2013 Activity: pelvic rest Diet: routine Medications: PNV, Ibuprofen, Iron and Percocet Condition: improved Instructions: See discharge instructions Discharge to: home Follow-up Information   Follow up with Geryl Rankins, MD. Schedule an appointment as soon as possible for a visit in 2 weeks. (Postpartum depression screen)    Contact information:   [address], [address] [place] [address] [phone]       Newborn Data: Live born female  Birth Weight: 7 lb 3 oz (3260 g) APGAR: 7, 9 Baby with jaundice.  Pt to remain inpatient.  Mother to room in.  Geryl Rankins 07/02/2013, 8:55 AM

## 2013-07-04 ENCOUNTER — Encounter (HOSPITAL_COMMUNITY)
Admission: RE | Admit: 2013-07-04 | Discharge: 2013-07-04 | Disposition: A | Discharge status: Home / Self Care | Payer: BC Managed Care – PPO | Source: Ambulatory Visit | Attending: Obstetrics and Gynecology | Admitting: Obstetrics and Gynecology

## 2013-07-04 DIAGNOSIS — O923 Agalactia: Secondary | ICD-10-CM | POA: Insufficient documentation

## 2013-07-05 IMAGING — US US OB FOLLOW-UP
1 series · 12 of 28 positions shown · non-contrast
Comparison: none

[Series 1: us ob follow-up · 0.20mm/px · 12 of 38 slices shown]
[im 2/38]
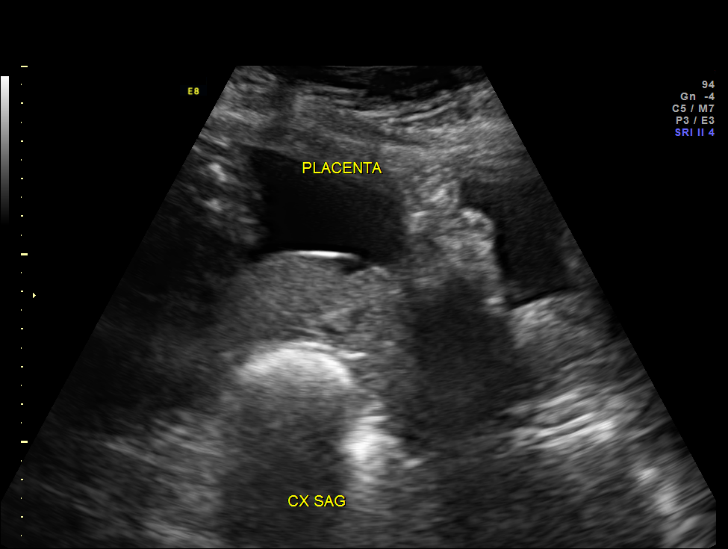
[im 5/38]
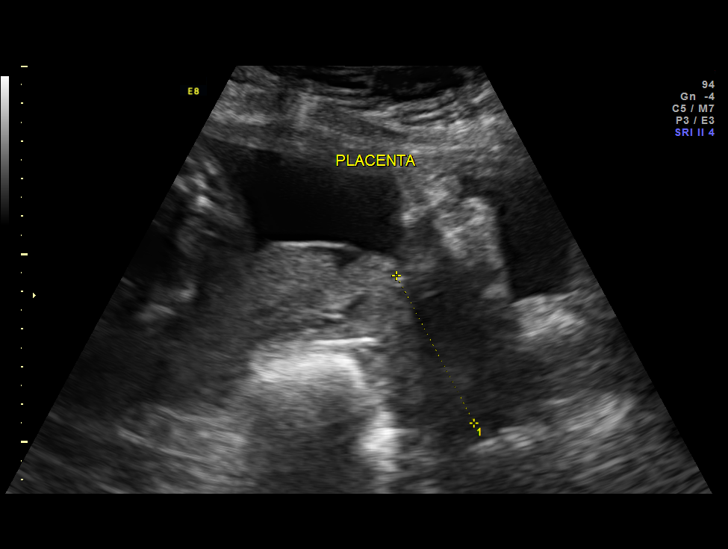
[im 7/38]
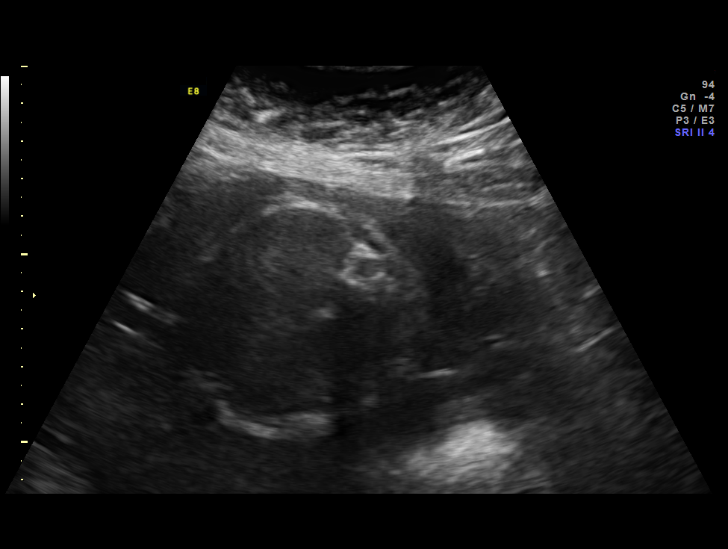
[im 11/38]
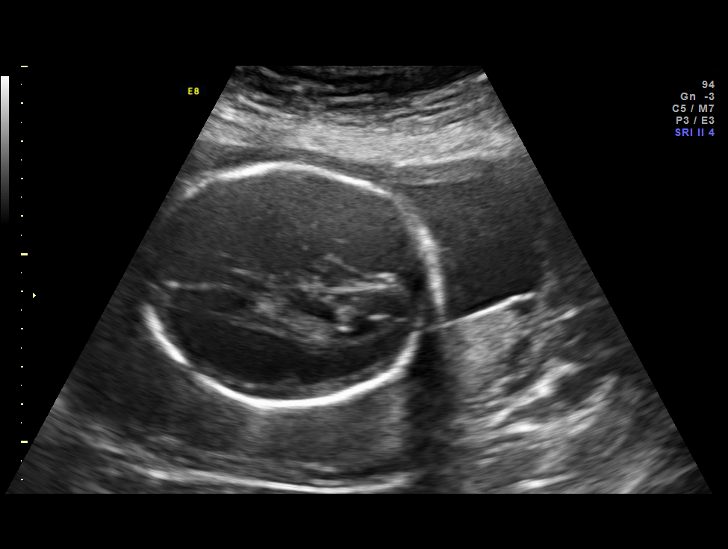
[im 14/38]
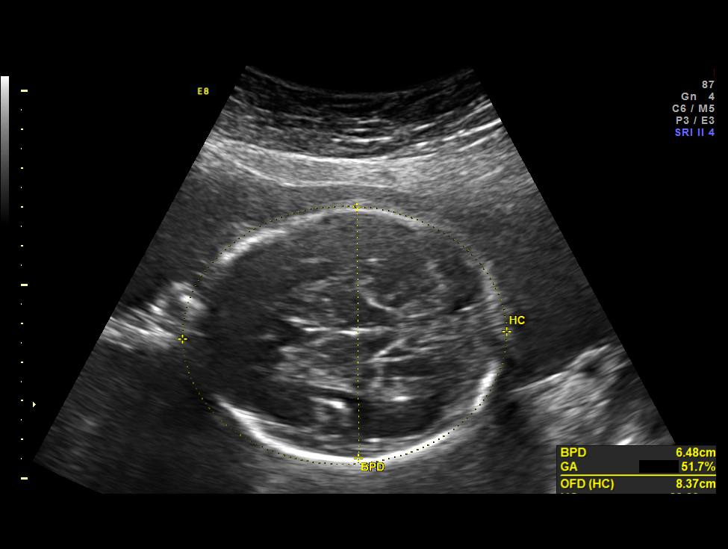
[im 17/38]
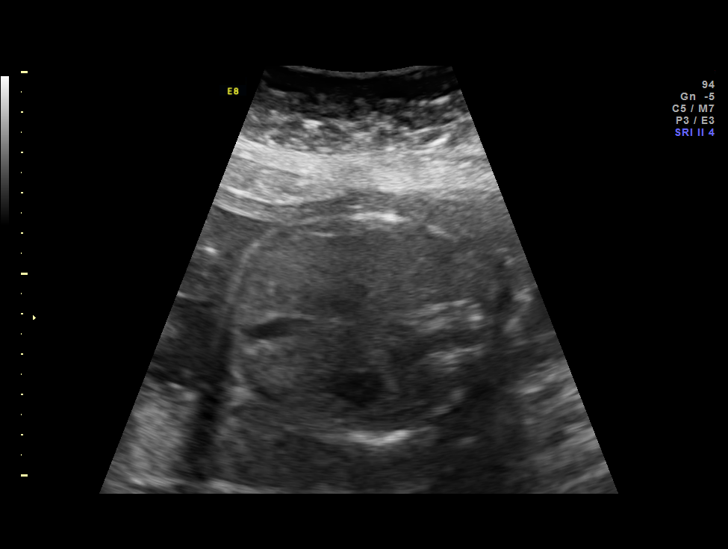
[im 21/38]
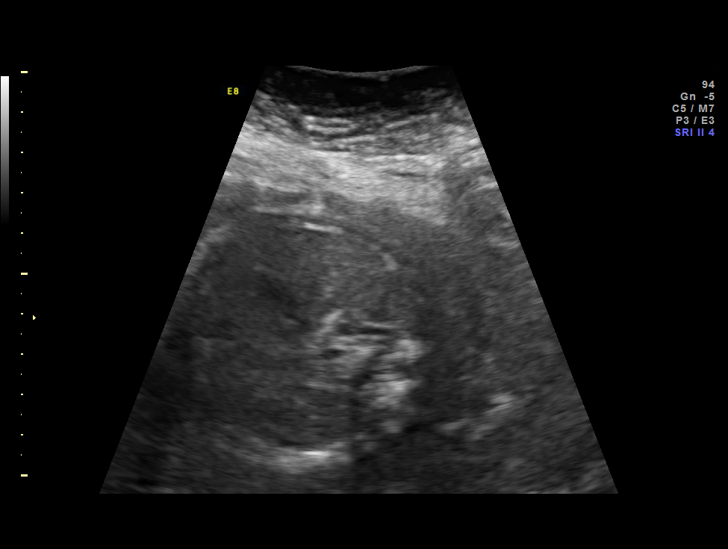
[im 24/38]
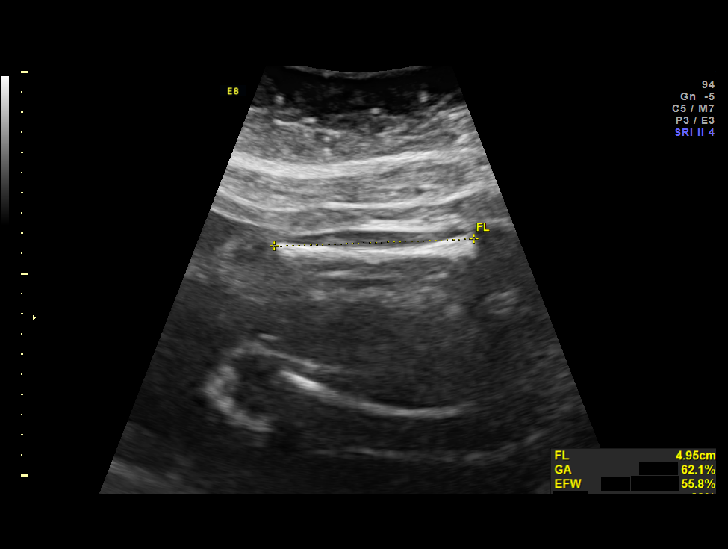
[im 27/38]
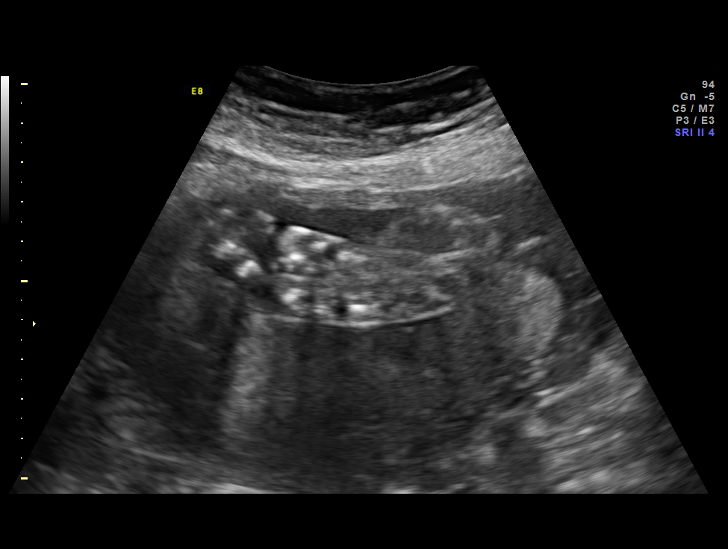
[im 31/38]
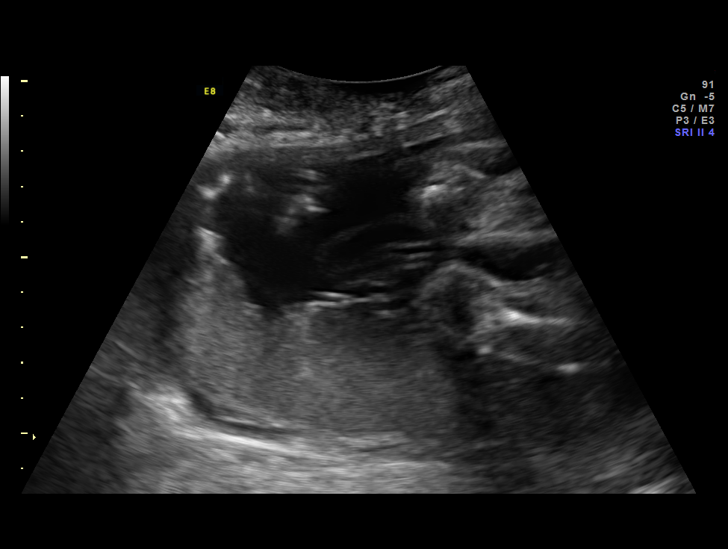
[im 33/38]
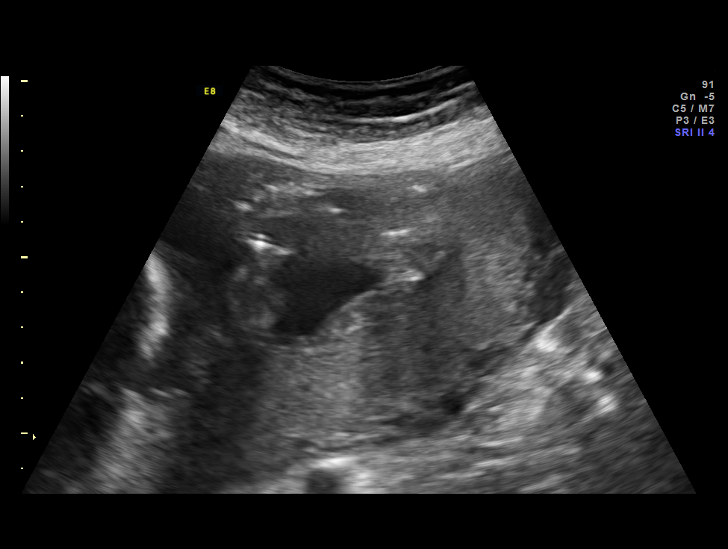
[im 36/38]
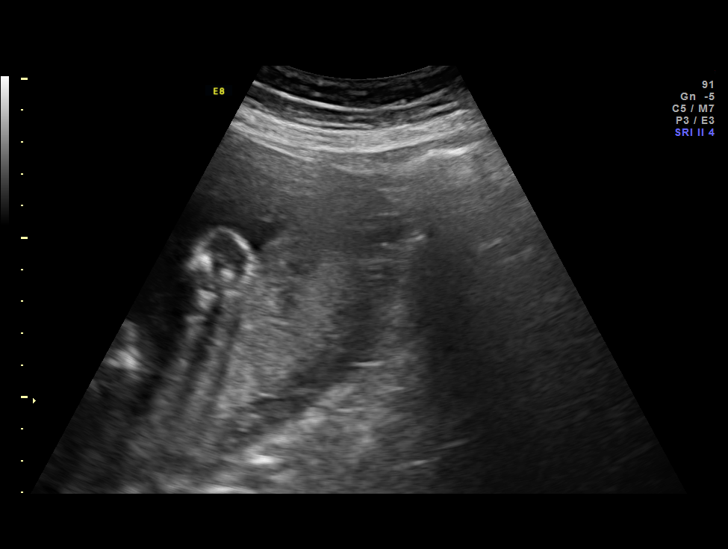

[12 of 28 positions shown; findings below may reference images not displayed]

OBSTETRICS REPORT
                      (Signed Final 04/08/2013 [DATE])

Service(s) Provided

 US OB FOLLOW UP                                       76816.1
Indications

 Follow-up incomplete fetal anatomic evaluation
 Placenta previa/Low lying: No bleeding
 Choroid plexus cyst
Fetal Evaluation

 Num Of Fetuses:    1
 Fetal Heart Rate:  138                          bpm
 Cardiac Activity:  Observed
 Presentation:      Variable
 Placenta:          Marginal Previa;
                    posterior
 P. Cord            Previously Visualized
 Insertion:

 Amniotic Fluid
 AFI FV:      Subjectively within normal limits
                                             Larg Pckt:     4.7  cm
Biometry

 BPD:     64.4  mm     G. Age:  26w 0d                CI:         77.5   70 - 86
 OFD:     83.1  mm                                    FL/HC:      21.1   18.6 -

 HC:     236.1  mm     G. Age:  25w 4d       21  %    HC/AC:      1.11   1.04 -

 AC:       213  mm     G. Age:  25w 6d       40  %    FL/BPD:     77.5   71 - 87
 FL:      49.9  mm     G. Age:  26w 6d       66  %    FL/AC:      23.4   20 - 24

 Est. FW:     903  gm           2 lb     60  %
Gestational Age

 LMP:           25w 0d        Date:  10/15/12                 EDD:   07/22/13
 U/S Today:     26w 1d                                        EDD:   07/14/13
 Best:          25w 6d     Det. By:  Early Ultrasound         EDD:   07/16/13
                                     (12/10/12)
Anatomy
 Cranium:          Appears normal         Aortic Arch:      Previously seen
 Fetal Cavum:      Appears normal         Ductal Arch:      Previously seen
 Ventricles:       Appears normal         Diaphragm:        Previously seen
 Choroid Plexus:   Appears normal         Stomach:          Appears normal, left
                                                            sided
 Cerebellum:       Previously seen        Abdomen:          Appears normal
 Posterior Fossa:  Previously seen        Abdominal Wall:   Previously seen
 Nuchal Fold:      Previously seen        Cord Vessels:     Previously seen
 Face:             Orbits previously      Kidneys:          Appear normal
                   seen
 Lips:             Previously seen        Bladder:          Appears normal
 Heart:            Appears normal         Spine:            Previously seen
                   (4CH, axis, and
                   situs)
 RVOT:             Not well visualized    Lower             Previously seen
                                          Extremities:
 LVOT:             Previously seen        Upper             Previously seen
                                          Extremities:

 Other:  Fetus appears to be a female. Heels and 5th digit previously
         visualized. Nasal bone previously visualized. Technically difficult due
         to maternal habitus and fetal position.
Cervix Uterus Adnexa

 Cervical Length:    4.1      cm

 Cervix:       Normal appearance by transabdominal scan.
 Uterus:       No abnormality visualized.
 Cul De Sac:   No free fluid seen.
 Left Ovary:    Not visualized.
 Right Ovary:   Not visualized.

 Adnexa:     No abnormality visualized.
Impression

 Single IUP at 25 [DATE] weeks
 Normal interval anatomy.  The previously noted choroid
 plexus cyst appears to have resolved
 Fetal growth is appropriate (60th %tile)
 Marginal posterior placenta previa
 Normal amniotic fluid volume
Recommendations

 Recommend follow-up ultrasound examination in 4 weeks for
 interval growth and to reevaluate placental location.

 questions or concerns.

## 2013-07-14 ENCOUNTER — Other Ambulatory Visit (HOSPITAL_COMMUNITY): Payer: BC Managed Care – PPO

## 2013-07-16 ENCOUNTER — Inpatient Hospital Stay (HOSPITAL_COMMUNITY): Admit: 2013-07-16 | Payer: Self-pay | Admitting: Obstetrics and Gynecology

## 2013-07-16 ENCOUNTER — Encounter (HOSPITAL_COMMUNITY): Payer: Self-pay

## 2013-07-16 SURGERY — Surgical Case
Anesthesia: Regional

## 2013-08-04 ENCOUNTER — Encounter (HOSPITAL_COMMUNITY)
Admission: RE | Admit: 2013-08-04 | Discharge: 2013-08-04 | Disposition: A | Discharge status: Home / Self Care | Payer: BC Managed Care – PPO | Source: Ambulatory Visit | Attending: Obstetrics and Gynecology | Admitting: Obstetrics and Gynecology

## 2013-08-04 DIAGNOSIS — O923 Agalactia: Secondary | ICD-10-CM | POA: Insufficient documentation

## 2014-08-24 ENCOUNTER — Other Ambulatory Visit (HOSPITAL_COMMUNITY)
Admission: RE | Admit: 2014-08-24 | Discharge: 2014-08-24 | Disposition: A | Discharge status: Home / Self Care | Payer: BC Managed Care – PPO | Source: Ambulatory Visit | Attending: Obstetrics and Gynecology | Admitting: Obstetrics and Gynecology

## 2014-08-24 ENCOUNTER — Other Ambulatory Visit: Payer: Self-pay | Admitting: Obstetrics and Gynecology

## 2014-08-24 DIAGNOSIS — Z01419 Encounter for gynecological examination (general) (routine) without abnormal findings: Secondary | ICD-10-CM | POA: Insufficient documentation

## 2014-08-24 DIAGNOSIS — Z1151 Encounter for screening for human papillomavirus (HPV): Secondary | ICD-10-CM | POA: Insufficient documentation

## 2014-08-25 LAB — CYTOLOGY - PAP

## 2018-03-05 ENCOUNTER — Other Ambulatory Visit (HOSPITAL_COMMUNITY)
Admission: RE | Admit: 2018-03-05 | Discharge: 2018-03-05 | Disposition: A | Discharge status: Home / Self Care | Payer: Managed Care, Other (non HMO) | Source: Ambulatory Visit | Attending: Obstetrics and Gynecology | Admitting: Obstetrics and Gynecology

## 2018-03-05 ENCOUNTER — Other Ambulatory Visit: Payer: Self-pay | Admitting: Obstetrics and Gynecology

## 2018-03-05 DIAGNOSIS — Z01411 Encounter for gynecological examination (general) (routine) with abnormal findings: Secondary | ICD-10-CM | POA: Insufficient documentation

## 2018-03-07 LAB — CYTOLOGY - PAP
Diagnosis: NEGATIVE
HPV: NOT DETECTED

## 2018-08-20 ENCOUNTER — Other Ambulatory Visit: Payer: Self-pay | Admitting: Obstetrics and Gynecology

## 2018-08-20 DIAGNOSIS — Z1231 Encounter for screening mammogram for malignant neoplasm of breast: Secondary | ICD-10-CM

## 2018-09-30 ENCOUNTER — Ambulatory Visit: Payer: Managed Care, Other (non HMO)

## 2018-10-01 ENCOUNTER — Ambulatory Visit: Payer: Managed Care, Other (non HMO)

## 2018-11-12 ENCOUNTER — Ambulatory Visit
Admission: RE | Admit: 2018-11-12 | Discharge: 2018-11-12 | Disposition: A | Discharge status: Home / Self Care | Payer: Managed Care, Other (non HMO) | Source: Ambulatory Visit | Attending: Obstetrics and Gynecology | Admitting: Obstetrics and Gynecology

## 2018-11-12 DIAGNOSIS — Z1231 Encounter for screening mammogram for malignant neoplasm of breast: Secondary | ICD-10-CM

## 2018-11-14 ENCOUNTER — Other Ambulatory Visit: Payer: Self-pay | Admitting: Obstetrics and Gynecology

## 2018-11-14 DIAGNOSIS — R928 Other abnormal and inconclusive findings on diagnostic imaging of breast: Secondary | ICD-10-CM

## 2018-11-21 ENCOUNTER — Ambulatory Visit
Admission: RE | Admit: 2018-11-21 | Discharge: 2018-11-21 | Disposition: A | Discharge status: Home / Self Care | Payer: Managed Care, Other (non HMO) | Source: Ambulatory Visit | Attending: Obstetrics and Gynecology | Admitting: Obstetrics and Gynecology

## 2018-11-21 ENCOUNTER — Other Ambulatory Visit: Payer: Self-pay | Admitting: Obstetrics and Gynecology

## 2018-11-21 ENCOUNTER — Other Ambulatory Visit: Payer: Managed Care, Other (non HMO)

## 2018-11-21 DIAGNOSIS — R928 Other abnormal and inconclusive findings on diagnostic imaging of breast: Secondary | ICD-10-CM

## 2018-11-21 DIAGNOSIS — N63 Unspecified lump in unspecified breast: Secondary | ICD-10-CM

## 2019-05-28 ENCOUNTER — Ambulatory Visit: Payer: Managed Care, Other (non HMO)

## 2019-05-28 ENCOUNTER — Other Ambulatory Visit: Payer: Self-pay | Admitting: Obstetrics and Gynecology

## 2019-05-28 ENCOUNTER — Other Ambulatory Visit: Payer: Self-pay

## 2019-05-28 ENCOUNTER — Ambulatory Visit
Admission: RE | Admit: 2019-05-28 | Discharge: 2019-05-28 | Disposition: A | Discharge status: Home / Self Care | Payer: Managed Care, Other (non HMO) | Source: Ambulatory Visit | Attending: Obstetrics and Gynecology | Admitting: Obstetrics and Gynecology

## 2019-05-28 DIAGNOSIS — N63 Unspecified lump in unspecified breast: Secondary | ICD-10-CM

## 2019-11-23 ENCOUNTER — Other Ambulatory Visit: Payer: Managed Care, Other (non HMO)

## 2019-12-11 ENCOUNTER — Other Ambulatory Visit: Payer: Managed Care, Other (non HMO)

## 2020-02-26 ENCOUNTER — Ambulatory Visit
Admission: RE | Admit: 2020-02-26 | Discharge: 2020-02-26 | Disposition: A | Discharge status: Home / Self Care | Payer: Self-pay | Source: Ambulatory Visit | Attending: Obstetrics and Gynecology | Admitting: Obstetrics and Gynecology

## 2020-02-26 ENCOUNTER — Ambulatory Visit
Admission: RE | Admit: 2020-02-26 | Discharge: 2020-02-26 | Disposition: A | Discharge status: Home / Self Care | Payer: BC Managed Care – PPO | Source: Ambulatory Visit | Attending: Obstetrics and Gynecology | Admitting: Obstetrics and Gynecology

## 2020-02-26 ENCOUNTER — Other Ambulatory Visit: Payer: Self-pay

## 2020-02-26 DIAGNOSIS — N63 Unspecified lump in unspecified breast: Secondary | ICD-10-CM

## 2020-03-04 ENCOUNTER — Ambulatory Visit: Payer: BC Managed Care – PPO | Attending: Internal Medicine

## 2020-03-04 DIAGNOSIS — Z23 Encounter for immunization: Secondary | ICD-10-CM

## 2020-03-04 NOTE — Progress Notes (Signed)
   Covid-19 Vaccination Clinic  Name:  Misty Gibson    MRN: [id number] DOB: 03/08/1976  03/04/2020  [name] was observed post Covid-19 immunization for 15 minutes without incident. She was provided with Vaccine Information Sheet and instruction to access the V-Safe system.   [name] was instructed to call 911 with any severe reactions post vaccine: Marland Kitchen Difficulty breathing  . Swelling of face and throat  . A fast heartbeat  . A bad rash all over body  . Dizziness and weakness   Immunizations Administered    Name Date Dose VIS Date Route   Pfizer COVID-19 Vaccine 03/04/2020  4:01 PM 0.3 mL 11/13/2019 Intramuscular   Manufacturer: [employer], [employer]   Lot: [id number]   NDC: [address]-9

## 2020-03-28 ENCOUNTER — Ambulatory Visit: Payer: BC Managed Care – PPO | Attending: Internal Medicine

## 2020-03-28 DIAGNOSIS — Z23 Encounter for immunization: Secondary | ICD-10-CM

## 2020-03-28 NOTE — Progress Notes (Signed)
   Covid-19 Vaccination Clinic  Name:  Misty Gibson    MRN: [id number] DOB: January 26, 1976  03/28/2020  [name] was observed post Covid-19 immunization for 15 minutes without incident. She was provided with Vaccine Information Sheet and instruction to access the V-Safe system.   [name] was instructed to call 911 with any severe reactions post vaccine: Marland Kitchen Difficulty breathing  . Swelling of face and throat  . A fast heartbeat  . A bad rash all over body  . Dizziness and weakness   Immunizations Administered    Name Date Dose VIS Date Route   Pfizer COVID-19 Vaccine 03/28/2020  3:57 PM 0.3 mL 01/27/2019 Intramuscular   Manufacturer: [employer], [employer]   Lot: [id number]   NDC: [address]-9

## 2020-03-30 ENCOUNTER — Ambulatory Visit: Payer: BC Managed Care – PPO

## 2020-10-18 ENCOUNTER — Other Ambulatory Visit (HOSPITAL_BASED_OUTPATIENT_CLINIC_OR_DEPARTMENT_OTHER): Payer: Self-pay | Admitting: Internal Medicine

## 2020-10-18 ENCOUNTER — Ambulatory Visit: Payer: BC Managed Care – PPO | Attending: Internal Medicine

## 2020-10-18 DIAGNOSIS — Z23 Encounter for immunization: Secondary | ICD-10-CM

## 2020-10-18 MED FILL — PFIZER-BIONTECH COVID-19 VA: 30 | 1 days supply | Qty: 0 | Fill #0

## 2020-10-18 NOTE — Progress Notes (Signed)
   Covid-19 Vaccination Clinic  Name:  Misty Gibson    MRN: [id number] DOB: 21-Apr-1976  10/18/2020  [name] was observed post Covid-19 immunization for 15 minutes without incident. She was provided with Vaccine Information Sheet and instruction to access the V-Safe system.   [name] was instructed to call 911 with any severe reactions post vaccine: Marland Kitchen Difficulty breathing  . Swelling of face and throat  . A fast heartbeat  . A bad rash all over body  . Dizziness and weakness   Immunizations Administered    Name Date Dose VIS Date Route   Pfizer COVID-19 Vaccine 10/18/2020  2:22 PM 0.3 mL 09/21/2020 Intramuscular   Manufacturer: [employer], [employer]   Lot: [id number]   NDC: [address]-0

## 2021-01-16 ENCOUNTER — Other Ambulatory Visit (HOSPITAL_BASED_OUTPATIENT_CLINIC_OR_DEPARTMENT_OTHER): Payer: Self-pay | Admitting: Family Medicine

## 2021-01-16 DIAGNOSIS — Z1231 Encounter for screening mammogram for malignant neoplasm of breast: Secondary | ICD-10-CM

## 2021-03-01 ENCOUNTER — Other Ambulatory Visit: Payer: Self-pay

## 2021-03-01 ENCOUNTER — Ambulatory Visit (HOSPITAL_BASED_OUTPATIENT_CLINIC_OR_DEPARTMENT_OTHER)
Admission: RE | Admit: 2021-03-01 | Discharge: 2021-03-01 | Disposition: A | Discharge status: Home / Self Care | Payer: BC Managed Care – PPO | Source: Ambulatory Visit | Attending: Family Medicine | Admitting: Family Medicine

## 2021-03-01 ENCOUNTER — Encounter (HOSPITAL_BASED_OUTPATIENT_CLINIC_OR_DEPARTMENT_OTHER): Payer: Self-pay

## 2021-03-01 DIAGNOSIS — Z1231 Encounter for screening mammogram for malignant neoplasm of breast: Secondary | ICD-10-CM

## 2021-03-02 ENCOUNTER — Ambulatory Visit (HOSPITAL_BASED_OUTPATIENT_CLINIC_OR_DEPARTMENT_OTHER): Payer: BC Managed Care – PPO

## 2021-03-14 ENCOUNTER — Other Ambulatory Visit: Payer: Self-pay | Admitting: Obstetrics and Gynecology

## 2021-03-14 ENCOUNTER — Other Ambulatory Visit: Payer: Self-pay | Admitting: Family Medicine

## 2021-03-14 ENCOUNTER — Other Ambulatory Visit (HOSPITAL_BASED_OUTPATIENT_CLINIC_OR_DEPARTMENT_OTHER): Payer: Self-pay | Admitting: Family Medicine

## 2021-03-14 DIAGNOSIS — Z09 Encounter for follow-up examination after completed treatment for conditions other than malignant neoplasm: Secondary | ICD-10-CM

## 2021-03-14 DIAGNOSIS — Z1231 Encounter for screening mammogram for malignant neoplasm of breast: Secondary | ICD-10-CM

## 2021-03-16 ENCOUNTER — Other Ambulatory Visit (HOSPITAL_BASED_OUTPATIENT_CLINIC_OR_DEPARTMENT_OTHER): Payer: Self-pay | Admitting: Family Medicine

## 2021-03-16 DIAGNOSIS — N63 Unspecified lump in unspecified breast: Secondary | ICD-10-CM

## 2021-04-03 ENCOUNTER — Other Ambulatory Visit: Payer: Self-pay | Admitting: Family Medicine

## 2021-04-03 DIAGNOSIS — N63 Unspecified lump in unspecified breast: Secondary | ICD-10-CM

## 2021-04-06 ENCOUNTER — Inpatient Hospital Stay: Admission: RE | Admit: 2021-04-06 | Payer: BC Managed Care – PPO | Source: Ambulatory Visit

## 2021-05-29 ENCOUNTER — Other Ambulatory Visit: Payer: Self-pay | Admitting: Family Medicine

## 2021-05-29 ENCOUNTER — Ambulatory Visit
Admission: RE | Admit: 2021-05-29 | Discharge: 2021-05-29 | Disposition: A | Discharge status: Home / Self Care | Payer: BC Managed Care – PPO | Source: Ambulatory Visit | Attending: Family Medicine | Admitting: Family Medicine

## 2021-05-29 ENCOUNTER — Other Ambulatory Visit: Payer: Self-pay

## 2021-05-29 DIAGNOSIS — N63 Unspecified lump in unspecified breast: Secondary | ICD-10-CM

## 2021-06-06 ENCOUNTER — Ambulatory Visit
Admission: RE | Admit: 2021-06-06 | Discharge: 2021-06-06 | Disposition: A | Discharge status: Home / Self Care | Payer: BC Managed Care – PPO | Source: Ambulatory Visit | Attending: Family Medicine | Admitting: Family Medicine

## 2021-06-06 ENCOUNTER — Other Ambulatory Visit: Payer: Self-pay

## 2021-06-06 DIAGNOSIS — N63 Unspecified lump in unspecified breast: Secondary | ICD-10-CM

## 2021-09-14 ENCOUNTER — Ambulatory Visit: Payer: BC Managed Care – PPO

## 2021-09-29 ENCOUNTER — Ambulatory Visit: Payer: BC Managed Care – PPO | Attending: Internal Medicine

## 2021-09-29 DIAGNOSIS — Z23 Encounter for immunization: Secondary | ICD-10-CM

## 2021-09-29 NOTE — Progress Notes (Signed)
   Covid-19 Vaccination Clinic  Name:  Misty Gibson    MRN: [id number] DOB: [birth year]  09/29/2021  [name] was observed post Covid-19 immunization for 15 minutes without incident. She was provided with Vaccine Information Sheet and instruction to access the V-Safe system.   [name] was instructed to call 911 with any severe reactions post vaccine: Difficulty breathing  Swelling of face and throat  A fast heartbeat  A bad rash all over body  Dizziness and weakness   Immunizations Administered     Name Date Dose VIS Date Route   Pfizer Covid-19 Vaccine Bivalent Booster 09/29/2021 12:48 PM 0.3 mL 08/02/2021 Intramuscular   Manufacturer: [employer], [employer]   Lot: [id number]   NDC: [id number]

## 2021-10-23 ENCOUNTER — Other Ambulatory Visit (HOSPITAL_BASED_OUTPATIENT_CLINIC_OR_DEPARTMENT_OTHER): Payer: Self-pay

## 2021-10-23 MED ORDER — PFIZER COVID-19 VAC BIVALENT 30 MCG/0.3ML IM SUSP
INTRAMUSCULAR | 0 refills | Status: AC
  Filled 2021-10-23: qty 0.3, 1d supply, fill #0

## 2022-07-18 ENCOUNTER — Other Ambulatory Visit (HOSPITAL_BASED_OUTPATIENT_CLINIC_OR_DEPARTMENT_OTHER): Payer: Self-pay | Admitting: Family Medicine

## 2022-07-18 DIAGNOSIS — Z1231 Encounter for screening mammogram for malignant neoplasm of breast: Secondary | ICD-10-CM

## 2022-07-24 ENCOUNTER — Encounter (HOSPITAL_BASED_OUTPATIENT_CLINIC_OR_DEPARTMENT_OTHER): Payer: Self-pay

## 2022-07-24 ENCOUNTER — Ambulatory Visit (HOSPITAL_BASED_OUTPATIENT_CLINIC_OR_DEPARTMENT_OTHER)
Admission: RE | Admit: 2022-07-24 | Discharge: 2022-07-24 | Disposition: A | Discharge status: Home / Self Care | Payer: BC Managed Care – PPO | Source: Ambulatory Visit | Attending: Family Medicine | Admitting: Family Medicine

## 2022-07-24 DIAGNOSIS — Z1231 Encounter for screening mammogram for malignant neoplasm of breast: Secondary | ICD-10-CM | POA: Insufficient documentation

## 2023-07-24 ENCOUNTER — Other Ambulatory Visit (HOSPITAL_BASED_OUTPATIENT_CLINIC_OR_DEPARTMENT_OTHER): Payer: Self-pay | Admitting: Family Medicine

## 2023-07-24 DIAGNOSIS — Z1231 Encounter for screening mammogram for malignant neoplasm of breast: Secondary | ICD-10-CM

## 2023-08-29 ENCOUNTER — Ambulatory Visit (HOSPITAL_BASED_OUTPATIENT_CLINIC_OR_DEPARTMENT_OTHER): Payer: BC Managed Care – PPO

## 2023-09-12 ENCOUNTER — Ambulatory Visit (HOSPITAL_BASED_OUTPATIENT_CLINIC_OR_DEPARTMENT_OTHER): Payer: Self-pay

## 2023-09-17 ENCOUNTER — Ambulatory Visit (HOSPITAL_BASED_OUTPATIENT_CLINIC_OR_DEPARTMENT_OTHER)
Admission: RE | Admit: 2023-09-17 | Discharge: 2023-09-17 | Disposition: A | Discharge status: Home / Self Care | Payer: BC Managed Care – PPO | Source: Ambulatory Visit | Attending: Family Medicine | Admitting: Family Medicine

## 2023-09-17 ENCOUNTER — Encounter (HOSPITAL_BASED_OUTPATIENT_CLINIC_OR_DEPARTMENT_OTHER): Payer: Self-pay

## 2023-09-17 DIAGNOSIS — Z1231 Encounter for screening mammogram for malignant neoplasm of breast: Secondary | ICD-10-CM | POA: Insufficient documentation

## 2024-11-10 ENCOUNTER — Other Ambulatory Visit (HOSPITAL_BASED_OUTPATIENT_CLINIC_OR_DEPARTMENT_OTHER): Payer: Self-pay | Admitting: Internal Medicine

## 2024-11-10 DIAGNOSIS — Z1231 Encounter for screening mammogram for malignant neoplasm of breast: Secondary | ICD-10-CM

## 2024-11-16 ENCOUNTER — Encounter (HOSPITAL_BASED_OUTPATIENT_CLINIC_OR_DEPARTMENT_OTHER): Payer: Self-pay

## 2024-11-16 ENCOUNTER — Inpatient Hospital Stay (HOSPITAL_BASED_OUTPATIENT_CLINIC_OR_DEPARTMENT_OTHER)
Admission: RE | Admit: 2024-11-16 | Discharge: 2024-11-16 | Disposition: A | Source: Ambulatory Visit | Attending: Internal Medicine | Admitting: Internal Medicine

## 2024-11-16 DIAGNOSIS — Z1231 Encounter for screening mammogram for malignant neoplasm of breast: Secondary | ICD-10-CM

## 2024-11-19 ENCOUNTER — Ambulatory Visit (HOSPITAL_BASED_OUTPATIENT_CLINIC_OR_DEPARTMENT_OTHER)
